# Patient Record
Sex: Male | Born: 1980
Health system: Southern US, Community
[De-identification: ages and names within clinical notes are randomized; demographics above are authoritative.]

## PROBLEM LIST (undated history)

## (undated) DIAGNOSIS — K219 Gastro-esophageal reflux disease without esophagitis: Secondary | ICD-10-CM

## (undated) DIAGNOSIS — I10 Essential (primary) hypertension: Secondary | ICD-10-CM

## (undated) DIAGNOSIS — Z87442 Personal history of urinary calculi: Secondary | ICD-10-CM

## (undated) DIAGNOSIS — N201 Calculus of ureter: Secondary | ICD-10-CM

## (undated) DIAGNOSIS — R3915 Urgency of urination: Secondary | ICD-10-CM

## (undated) DIAGNOSIS — N200 Calculus of kidney: Secondary | ICD-10-CM

## (undated) DIAGNOSIS — Z973 Presence of spectacles and contact lenses: Secondary | ICD-10-CM

## (undated) HISTORY — PX: ORCHIOPEXY: SUR915

---

## 2004-11-15 ENCOUNTER — Emergency Department (HOSPITAL_COMMUNITY): Admission: EM | Admit: 2004-11-15 | Discharge: 2004-11-15 | Payer: Self-pay | Admitting: Emergency Medicine

## 2006-02-20 ENCOUNTER — Emergency Department (HOSPITAL_COMMUNITY): Admission: EM | Admit: 2006-02-20 | Discharge: 2006-02-20 | Payer: Self-pay | Admitting: Emergency Medicine

## 2007-08-08 DIAGNOSIS — Q760 Spina bifida occulta: Secondary | ICD-10-CM | POA: Insufficient documentation

## 2007-08-08 DIAGNOSIS — K921 Melena: Secondary | ICD-10-CM | POA: Insufficient documentation

## 2007-08-08 DIAGNOSIS — K5900 Constipation, unspecified: Secondary | ICD-10-CM | POA: Insufficient documentation

## 2007-08-09 ENCOUNTER — Ambulatory Visit: Payer: Self-pay | Admitting: Gastroenterology

## 2007-08-09 DIAGNOSIS — K6289 Other specified diseases of anus and rectum: Secondary | ICD-10-CM | POA: Insufficient documentation

## 2007-08-09 DIAGNOSIS — R195 Other fecal abnormalities: Secondary | ICD-10-CM | POA: Insufficient documentation

## 2007-08-22 ENCOUNTER — Ambulatory Visit: Payer: Self-pay | Admitting: Gastroenterology

## 2007-08-22 ENCOUNTER — Encounter: Payer: Self-pay | Admitting: Gastroenterology

## 2007-08-22 HISTORY — PX: COLONOSCOPY: SHX174

## 2007-08-24 ENCOUNTER — Encounter: Payer: Self-pay | Admitting: Gastroenterology

## 2007-08-29 DIAGNOSIS — K5289 Other specified noninfective gastroenteritis and colitis: Secondary | ICD-10-CM | POA: Insufficient documentation

## 2007-08-30 ENCOUNTER — Telehealth: Payer: Self-pay | Admitting: Gastroenterology

## 2007-09-01 ENCOUNTER — Ambulatory Visit: Payer: Self-pay | Admitting: Cardiology

## 2007-09-20 ENCOUNTER — Telehealth: Payer: Self-pay | Admitting: Gastroenterology

## 2007-09-26 ENCOUNTER — Ambulatory Visit: Payer: Self-pay | Admitting: Gastroenterology

## 2013-10-31 ENCOUNTER — Ambulatory Visit (INDEPENDENT_AMBULATORY_CARE_PROVIDER_SITE_OTHER): Payer: BC Managed Care – PPO | Admitting: General Surgery

## 2014-08-09 ENCOUNTER — Encounter: Payer: Self-pay | Admitting: Gastroenterology

## 2015-06-17 ENCOUNTER — Encounter (HOSPITAL_COMMUNITY): Payer: Self-pay | Admitting: Emergency Medicine

## 2015-06-17 ENCOUNTER — Emergency Department (HOSPITAL_COMMUNITY): Payer: BLUE CROSS/BLUE SHIELD

## 2015-06-17 ENCOUNTER — Emergency Department (HOSPITAL_COMMUNITY)
Admission: EM | Admit: 2015-06-17 | Discharge: 2015-06-17 | Disposition: A | Discharge status: Home / Self Care | Payer: BLUE CROSS/BLUE SHIELD | Attending: Emergency Medicine | Admitting: Emergency Medicine

## 2015-06-17 DIAGNOSIS — R197 Diarrhea, unspecified: Secondary | ICD-10-CM | POA: Diagnosis not present

## 2015-06-17 DIAGNOSIS — N201 Calculus of ureter: Secondary | ICD-10-CM | POA: Diagnosis not present

## 2015-06-17 DIAGNOSIS — Z79899 Other long term (current) drug therapy: Secondary | ICD-10-CM | POA: Diagnosis not present

## 2015-06-17 DIAGNOSIS — I10 Essential (primary) hypertension: Secondary | ICD-10-CM | POA: Diagnosis not present

## 2015-06-17 DIAGNOSIS — R110 Nausea: Secondary | ICD-10-CM | POA: Diagnosis not present

## 2015-06-17 DIAGNOSIS — R109 Unspecified abdominal pain: Secondary | ICD-10-CM | POA: Diagnosis present

## 2015-06-17 DIAGNOSIS — R1031 Right lower quadrant pain: Secondary | ICD-10-CM | POA: Diagnosis not present

## 2015-06-17 HISTORY — DX: Essential (primary) hypertension: I10

## 2015-06-17 LAB — URINALYSIS, ROUTINE W REFLEX MICROSCOPIC
Bilirubin Urine: NEGATIVE
Glucose, UA: NEGATIVE mg/dL
KETONES UR: NEGATIVE mg/dL
LEUKOCYTES UA: NEGATIVE
NITRITE: NEGATIVE
PROTEIN: NEGATIVE mg/dL
Specific Gravity, Urine: 1.019 (ref 1.005–1.030)
pH: 8 (ref 5.0–8.0)

## 2015-06-17 LAB — POC OCCULT BLOOD, ED: Fecal Occult Bld: NEGATIVE

## 2015-06-17 LAB — URINE MICROSCOPIC-ADD ON
SQUAMOUS EPITHELIAL / LPF: NONE SEEN
WBC, UA: NONE SEEN WBC/hpf (ref 0–5)

## 2015-06-17 LAB — CBC
HEMATOCRIT: 43.2 % (ref 39.0–52.0)
HEMOGLOBIN: 13.8 g/dL (ref 13.0–17.0)
MCH: 27.8 pg (ref 26.0–34.0)
MCHC: 31.9 g/dL (ref 30.0–36.0)
MCV: 87.1 fL (ref 78.0–100.0)
Platelets: 231 10*3/uL (ref 150–400)
RBC: 4.96 MIL/uL (ref 4.22–5.81)
RDW: 13.4 % (ref 11.5–15.5)
WBC: 7 10*3/uL (ref 4.0–10.5)

## 2015-06-17 LAB — COMPREHENSIVE METABOLIC PANEL
ALBUMIN: 4.3 g/dL (ref 3.5–5.0)
ALT: 23 U/L (ref 17–63)
ANION GAP: 10 (ref 5–15)
AST: 20 U/L (ref 15–41)
Alkaline Phosphatase: 54 U/L (ref 38–126)
BUN: 10 mg/dL (ref 6–20)
CHLORIDE: 105 mmol/L (ref 101–111)
CO2: 25 mmol/L (ref 22–32)
Calcium: 9.9 mg/dL (ref 8.9–10.3)
Creatinine, Ser: 1.1 mg/dL (ref 0.61–1.24)
GFR calc Af Amer: >60 mL/min (ref >60)
GFR calc non Af Amer: >60 mL/min (ref >60)
GLUCOSE: 138 mg/dL — AB (ref 65–99)
POTASSIUM: 4 mmol/L (ref 3.5–5.1)
SODIUM: 140 mmol/L (ref 135–145)
TOTAL PROTEIN: 7.2 g/dL (ref 6.5–8.1)
Total Bilirubin: 0.5 mg/dL (ref 0.3–1.2)

## 2015-06-17 LAB — LIPASE, BLOOD: LIPASE: 24 U/L (ref 11–51)

## 2015-06-17 MED ORDER — HYDROMORPHONE HCL 1 MG/ML IJ SOLN
1.0000 mg | Freq: Once | INTRAMUSCULAR | Status: AC
  Administered 2015-06-17: 1 mg via INTRAVENOUS
  Filled 2015-06-17: qty 1

## 2015-06-17 MED ORDER — IOPAMIDOL (ISOVUE-370) INJECTION 76%
INTRAVENOUS | Status: AC
  Filled 2015-06-17: qty 100

## 2015-06-17 MED ORDER — ONDANSETRON HCL 4 MG/2ML IJ SOLN
4.0000 mg | Freq: Once | INTRAMUSCULAR | Status: AC
  Administered 2015-06-17: 4 mg via INTRAVENOUS
  Filled 2015-06-17: qty 2

## 2015-06-17 MED ORDER — SODIUM CHLORIDE 0.9 % IV BOLUS (SEPSIS)
1000.0000 mL | Freq: Once | INTRAVENOUS | Status: AC
  Administered 2015-06-17: 1000 mL via INTRAVENOUS

## 2015-06-17 MED ORDER — OXYCODONE-ACETAMINOPHEN 5-325 MG PO TABS: 2.0000 | ORAL_TABLET | ORAL | Status: DC | PRN

## 2015-06-17 MED ORDER — IOPAMIDOL (ISOVUE-300) INJECTION 61%
INTRAVENOUS | Status: AC
  Administered 2015-06-17: 100 mL
  Filled 2015-06-17: qty 100

## 2015-06-17 MED ORDER — TAMSULOSIN HCL 0.4 MG PO CAPS: 0.4000 mg | ORAL_CAPSULE | Freq: Every day | ORAL | Status: DC

## 2015-06-17 MED ORDER — ONDANSETRON HCL 4 MG PO TABS: 4.0000 mg | ORAL_TABLET | Freq: Four times a day (QID) | ORAL | Status: DC

## 2015-06-17 NOTE — ED Provider Notes (Signed)
CSN: [id number]     Arrival date & time 06/17/15  0804 History   First MD Initiated Contact with Patient 06/17/15 [phone]     Chief Complaint  Patient presents with  . Abdominal Pain   HPI Comments: 35 year old male with PMH of HTN who presents with right-sided abdominal pain and flank pain. He states he has had intermittent pain for several months however the pain usually goes away within 30 minutes. This morning it has been the worst and has been constant. Reports associated nausea, tenesmus, melena. Denies fever, chills, chest pain, shortness of breath, vomiting, constipation, dysuria, blood in urine. He has had a colonoscopy and endoscopy in 2009 with which has been suggestive of Crohn's disease and showed hemorrhoids. No hx of nephro or urolithiasis. Sg hx significant for Orchiopexy.  Patient is a 35 y.o. male presenting with abdominal pain.  Abdominal Pain Associated symptoms: diarrhea   Associated symptoms: no chest pain, no chills, no constipation, no dysuria, no fever, no hematuria, no nausea, no shortness of breath and no vomiting     Past Medical History  Diagnosis Date  . Hypertension    Past Surgical History  Procedure Laterality Date  . Testicle surgery     No family history on file. Social History  Substance Use Topics  . Smoking status: Never Smoker   . Smokeless tobacco: None  . Alcohol Use: Yes     Comment: social    Review of Systems  Constitutional: Negative for fever and chills.  Respiratory: Negative for shortness of breath.   Cardiovascular: Negative for chest pain.  Gastrointestinal: Positive for abdominal pain and diarrhea. Negative for nausea, vomiting, constipation and blood in stool.  Genitourinary: Positive for flank pain. Negative for dysuria, urgency, frequency, hematuria, penile swelling, difficulty urinating and testicular pain.  All other systems reviewed and are negative.   Allergies  Review of patient's allergies indicates no known  allergies.  Home Medications   Prior to Admission medications   Medication Sig Start Date End Date Taking? Authorizing Provider  amLODipine (NORVASC) 10 MG tablet Take 10 mg by mouth daily.   Yes Historical Provider, MD  lisinopril-hydrochlorothiazide (PRINZIDE,ZESTORETIC) 10-12.5 MG tablet Take 1 tablet by mouth daily.   Yes Historical Provider, MD  Naproxen Sodium (ALEVE) 220 MG CAPS Take 220 mg by mouth every 8 (eight) hours as needed.   Yes Historical Provider, MD   BP 147/90 mmHg  Pulse 97  Temp(Src) 97.9 F (36.6 C) (Oral)  Resp 17  Ht  (1.651 m)  Wt 76.204 kg  BMI 27.96 kg/m2  SpO2 100%   Physical Exam  Constitutional: He is oriented to person, place, and time. He appears well-developed and well-nourished. He appears distressed.  Writhing in bed  HENT:  Head: Normocephalic and atraumatic.  Eyes: Conjunctivae are normal. Pupils are equal, round, and reactive to light. Right eye exhibits no discharge. Left eye exhibits no discharge. No scleral icterus.  Neck: Normal range of motion.  Cardiovascular: Normal rate and regular rhythm.  Exam reveals no gallop and no friction rub.   No murmur heard. Pulmonary/Chest: Effort normal. No respiratory distress. He has no wheezes. He has no rales. He exhibits no tenderness.  Abdominal: Soft. Bowel sounds are normal. He exhibits no distension and no mass. There is tenderness. There is CVA tenderness. There is no rebound and no guarding. No hernia.  RLQ tenderness. R CVA tenderness  Genitourinary: Guaiac negative stool.  Rectal exam: Negative without mass, lesions or tenderness. Chaperone present  Neurological: He is alert and oriented to person, place, and time.  Skin: Skin is warm and dry.  Psychiatric: He has a normal mood and affect.    ED Course  Procedures (including critical care time) Labs Review Labs Reviewed  COMPREHENSIVE METABOLIC PANEL - Abnormal; Notable for the following:    Glucose, Bld 138 (*)    All other  components within normal limits  URINALYSIS, ROUTINE W REFLEX MICROSCOPIC (NOT AT [facility]) - Abnormal; Notable for the following:    APPearance TURBID (*)    Hgb urine dipstick LARGE (*)    All other components within normal limits  URINE MICROSCOPIC-ADD ON - Abnormal; Notable for the following:    Bacteria, UA RARE (*)    All other components within normal limits  LIPASE, BLOOD  CBC  POC OCCULT BLOOD, ED   Imaging Review Ct Abdomen Pelvis W Contrast  06/17/2015  CLINICAL DATA:  Right lower quadrant pain EXAM: CT ABDOMEN AND PELVIS WITH CONTRAST TECHNIQUE: Multidetector CT imaging of the abdomen and pelvis was performed using the standard protocol following bolus administration of intravenous contrast. CONTRAST:  ISOVUE-300 IOPAMIDOL (ISOVUE-300) INJECTION 61% COMPARISON:  09/01/2007 FINDINGS: 5 mm right ureterovesical junction calculus is associated with mild right hydronephrosis, mild right hydroureter, stranding adjacent to the proximal ureter, and delayed right nephrogram. Several hypodensities in both kidneys are not significantly changed. The previously seen calculus in the right kidney has passed to the right ureterovesical junction. Subsegmental atelectasis at the lung bases. Diffuse hepatic steatosis Gallbladder, spleen, pancreas, adrenal glands are within normal limits Normal appendix. Bladder and prostate are otherwise within normal limits. No focal mass in the colon. No disproportionate dilatation of small bowel. No free-fluid.  No abnormal retroperitoneal adenopathy. No vertebral compression deformity. IMPRESSION: 5 mm right ureterovesical junction calculus is associated with secondary findings of right ureteral obstruction. Electronically Signed   By: Jolaine Click M.D.   On: 06/17/2015 11:03   I have personally reviewed and evaluated these images and lab results as part of my medical decision-making.   EKG Interpretation None     Meds given in ED:  Medications  iopamidol  (ISOVUE-370) 76 % injection (not administered)  HYDROmorphone (DILAUDID) injection 1 mg (1 mg Intravenous Given 06/17/15 0915)  sodium chloride 0.9 % bolus 1,000 mL (1,000 mLs Intravenous New Bag/Given 06/17/15 0916)  ondansetron (ZOFRAN) injection 4 mg (4 mg Intravenous Given 06/17/15 0918)  HYDROmorphone (DILAUDID) injection 1 mg (1 mg Intravenous Given 06/17/15 0954)  iopamidol (ISOVUE-300) 61 % injection (100 mLs  Contrast Given 06/17/15 1048)    New Prescriptions   No medications on file     MDM   Final diagnoses:  Calculus of ureter  Nausea   35 year old male who presents with constant right sided abdominal pain since this morning. His symptoms are most likely due to urolithiasis. CT of abdomen shows 5mm obstructive stone in right ureterovesical junction with right hydronephrosis.   IVF, Dilaudid x 2 given which he states has brought his pain down to a 2/10, Zofran given  Labs are unremarkable. UA positive for RBCs without WBCs.Because he was reporting melena a rectal exam was done which was negative for occult blood. Vitals signs are stable.   He is afebrile, with clean urine, WBC 7.0, and has adequate pain control. Consulted Dr. Barron Alvine with urology who recommended outpatient follow up in 1-2 weeks with pain control and alpha blocker to allow stone to pass. Patient informed of clinical course, understands medical  decision-making process, and agrees with plan.   Bethel BornKelly Marie Gekas, PA-C 06/18/15 1213  Arby BarretteMarcy Pfeiffer, MD 06/20/15 [phone]

## 2015-06-17 NOTE — ED Notes (Signed)
Pt reports RLQ abd pain which radiates to his lower back. Pt alert x4. NAD at this time

## 2015-06-17 NOTE — ED Notes (Signed)
Pt ambulated to restroom. 

## 2015-06-21 DIAGNOSIS — Z0000 Encounter for general adult medical examination without abnormal findings: Secondary | ICD-10-CM | POA: Diagnosis not present

## 2015-06-21 DIAGNOSIS — N201 Calculus of ureter: Secondary | ICD-10-CM | POA: Diagnosis not present

## 2015-06-24 ENCOUNTER — Other Ambulatory Visit: Payer: Self-pay | Admitting: Urology

## 2015-06-24 ENCOUNTER — Encounter (HOSPITAL_COMMUNITY): Payer: Self-pay

## 2015-06-27 ENCOUNTER — Encounter (HOSPITAL_COMMUNITY)
Admission: RE | Disposition: A | Discharge status: Home / Self Care | Payer: Self-pay | Source: Ambulatory Visit | Attending: Urology

## 2015-06-27 ENCOUNTER — Ambulatory Visit (HOSPITAL_COMMUNITY): Payer: BLUE CROSS/BLUE SHIELD

## 2015-06-27 ENCOUNTER — Ambulatory Visit (HOSPITAL_COMMUNITY)
Admission: RE | Admit: 2015-06-27 | Discharge: 2015-06-27 | Disposition: A | Discharge status: Home / Self Care | Payer: BLUE CROSS/BLUE SHIELD | Source: Ambulatory Visit | Attending: Urology | Admitting: Urology

## 2015-06-27 ENCOUNTER — Encounter (HOSPITAL_COMMUNITY): Payer: Self-pay | Admitting: *Deleted

## 2015-06-27 DIAGNOSIS — Z791 Long term (current) use of non-steroidal anti-inflammatories (NSAID): Secondary | ICD-10-CM | POA: Insufficient documentation

## 2015-06-27 DIAGNOSIS — Z79899 Other long term (current) drug therapy: Secondary | ICD-10-CM | POA: Diagnosis not present

## 2015-06-27 DIAGNOSIS — Z87442 Personal history of urinary calculi: Secondary | ICD-10-CM | POA: Insufficient documentation

## 2015-06-27 DIAGNOSIS — I10 Essential (primary) hypertension: Secondary | ICD-10-CM | POA: Insufficient documentation

## 2015-06-27 DIAGNOSIS — N281 Cyst of kidney, acquired: Secondary | ICD-10-CM | POA: Diagnosis not present

## 2015-06-27 DIAGNOSIS — N201 Calculus of ureter: Secondary | ICD-10-CM | POA: Diagnosis not present

## 2015-06-27 DIAGNOSIS — Z841 Family history of disorders of kidney and ureter: Secondary | ICD-10-CM | POA: Insufficient documentation

## 2015-06-27 DIAGNOSIS — Z01818 Encounter for other preprocedural examination: Secondary | ICD-10-CM | POA: Diagnosis not present

## 2015-06-27 DIAGNOSIS — Z7982 Long term (current) use of aspirin: Secondary | ICD-10-CM | POA: Insufficient documentation

## 2015-06-27 HISTORY — PX: EXTRACORPOREAL SHOCK WAVE LITHOTRIPSY: SHX1557

## 2015-06-27 SURGERY — LITHOTRIPSY, ESWL
Anesthesia: LOCAL | Laterality: Right

## 2015-06-27 MED ORDER — CIPROFLOXACIN HCL 500 MG PO TABS
500.0000 mg | ORAL_TABLET | ORAL | Status: AC
  Administered 2015-06-27: 500 mg via ORAL
  Filled 2015-06-27: qty 1

## 2015-06-27 MED ORDER — OXYCODONE-ACETAMINOPHEN 5-325 MG PO TABS
1.0000 | ORAL_TABLET | Freq: Four times a day (QID) | ORAL | Status: DC | PRN
Start: 2015-06-27 — End: 2015-08-18

## 2015-06-27 MED ORDER — DIAZEPAM 5 MG PO TABS
10.0000 mg | ORAL_TABLET | ORAL | Status: AC
  Administered 2015-06-27: 10 mg via ORAL
  Filled 2015-06-27: qty 2

## 2015-06-27 MED ORDER — DIPHENHYDRAMINE HCL 25 MG PO CAPS
25.0000 mg | ORAL_CAPSULE | ORAL | Status: AC
  Administered 2015-06-27: 25 mg via ORAL
  Filled 2015-06-27: qty 1

## 2015-06-27 MED ORDER — SODIUM CHLORIDE 0.9 % IV SOLN
INTRAVENOUS | Status: DC
  Administered 2015-06-27: 15:00:00 via INTRAVENOUS

## 2015-06-27 NOTE — Interval H&P Note (Signed)
History and Physical Interval Note:  06/27/2015 3:44 PM  Arsen Chilton SiGreen  has presented today for surgery, with the diagnosis of RIGHT DISTAL STONE  The various methods of treatment have been discussed with the patient and family. After consideration of risks, benefits and other options for treatment, the patient has consented to  Procedure(s): RIGHT EXTRACORPOREAL SHOCK WAVE LITHOTRIPSY (ESWL) (Right) as a surgical intervention .  The patient's history has been reviewed, patient examined, no change in status, stable for surgery.  I have reviewed the patient's chart and labs.  Questions were answered to the patient's satisfaction.     Mahonri Seiden

## 2015-06-27 NOTE — H&P (Signed)
History of Present Illness Consult right ureteral stone referred by Dr. Donnald Garre. Patient seen in emergency department 4 days ago with right flank pain. CT scan revealed a 5 mm right UVJ stone with a stone visible on the scout image. I reviewed the images. No other stones were noted. Stable renal cysts. His UA showed 6-30 red blood cells with rare bacteria. BUN was 10 and creatinine 1.1. He passed a stone several years ago.     Today, his pain is controlled. He has some low back pain. He has not had right sided pain and not sure the stone passed. He is voiding without difficulty. He has some nausea but he is eating and drinking normally.   Past Medical History Problems  1. History of hypertension (Z86.79) 2. History of renal calculi (E95.284)  Surgical History Problems  1. History of Surgery Testis Exploration Of Undescended Testis Left  Current Meds 1. Aleve TABS;  Therapy: (Recorded:28Apr2017) to Recorded 2. AmLODIPine Besylate TABS;  Therapy: (Recorded:28Apr2017) to Recorded 3. Aspirin TABS;  Therapy: (Recorded:28Apr2017) to Recorded 4. Lisinopril-Hydrochlorothiazide TABS;  Therapy: (Recorded:28Apr2017) to Recorded  Allergies Medication  1. No Known Drug Allergies  Family History Problems  1. Family history of diabetes mellitus (Z83.3) : Father 2. Family history of hypertension (Z82.49) : Father 3. Family history of kidney stones (Z84.1) : Father 4. Family history of malignant neoplasm of prostate (Z44.42) : Grandfather  Social History Problems    Daily caffeine consumption, 1 serving a day   Married   Minimum alcohol consumption   Never a smoker   Two children  Review of Systems Genitourinary, constitutional, skin, eye, otolaryngeal, hematologic/lymphatic, cardiovascular, pulmonary, endocrine, musculoskeletal, gastrointestinal, neurological and psychiatric system(s) were reviewed and pertinent findings if present are noted and are otherwise negative.   Gastrointestinal: nausea and constipation.  Constitutional: night sweats and feeling tired (fatigue).  Respiratory: cough.  Musculoskeletal: back pain.  Neurological: headache.    Vitals Vital Signs [Data Includes: Last 1 Day]  Recorded: 28Apr2017 11:11AM  Height: 5 ft 5 in Weight: 168 lb  BMI Calculated: 27.96 BSA Calculated: 1.84 Blood Pressure: 143 / 84 Temperature: 98.5 F Heart Rate: 78  Physical Exam Constitutional: Well nourished and well developed . No acute distress.  Skin: Normal skin turgor, no visible rash and no visible skin lesions.    Results/Data Urine [Data Includes: Last 1 Day]   28Apr2017  COLOR YELLOW   APPEARANCE CLOUDY   SPECIFIC GRAVITY 1.010   pH 7.0   GLUCOSE NEGATIVE   BILIRUBIN NEGATIVE   KETONE NEGATIVE   BLOOD NEGATIVE   PROTEIN NEGATIVE   NITRITE NEGATIVE   LEUKOCYTE ESTERASE NEGATIVE   SQUAMOUS EPITHELIAL/HPF 0-5 HPF  WBC 0-5 WBC/HPF  RBC 0-2 RBC/HPF  BACTERIA NONE SEEN HPF  CRYSTALS See Below HPF  CASTS NONE SEEN LPF  Yeast NONE SEEN HPF   Old records or history reviewed:Marland Kitchen  The following images/tracing/specimen were independently visualized:  CT.    Procedure KUB-comparison to prior CT, findings: 5 mm density still present in the right distal ureter region, right pelvis. I didn't appreciate any other stones. The bones appeared normal. The bowel gas pattern appear normal.     Assessment Assessed  1. Right ureteral stone (N20.1)  Plan Right ureteral stone  1. Follow-up NP/PA Office  Follow-up  Status: Hold For - Appointment,Date of Service   Requested for: 28Apr2017 2. KUB; Status:Complete;   Done: 28Apr2017 11:41AM  Discussion/Summary Right ureteral stone-still present on KUB. We discussed the nature risk and  benefits of continued stone passage, off label use about blockers, ureteroscopy, shockwave lithotripsy. Patient interested in shockwave lithotripsy but would like to give it some time. He'll let me know next week if he  like to set that up.    cc: Dr. Donnald GarrePfeiffer     Signatures Electronically signed by : Jerilee FieldMatthew Dreyah Montrose, M.D.; Jun 21 2015 12:02PM EST

## 2015-06-27 NOTE — Op Note (Signed)
Right ESWL  Right 5 mm distal stone  Findings; stone faded well

## 2015-06-27 NOTE — Discharge Instructions (Signed)
Lithotripsy, Care After °Refer to this sheet in the next few weeks. These instructions provide you with information on caring for yourself after your procedure. Your health care provider may also give you more specific instructions. Your treatment has been planned according to current medical practices, but problems sometimes occur. Call your health care provider if you have any problems or questions after your procedure. °WHAT TO EXPECT AFTER THE PROCEDURE  °· Your urine may have a red tinge for a few days after treatment. Blood loss is usually minimal. °· You may have soreness in the back or flank area. This usually goes away after a few days. The procedure can cause blotches or bruises on the back where the pressure wave enters the skin. These marks usually cause only minimal discomfort and should disappear in a short time. °· Stone fragments should begin to pass within 24 hours of treatment. However, a delayed passage is not unusual. °· You may have pain, discomfort, and feel sick to your stomach (nauseated) when the crushed fragments of stone are passed down the tube from the kidney to the bladder. Stone fragments can pass soon after the procedure and may last for up to 4-8 weeks. °· A small number of patients may have severe pain when stone fragments are not able to pass, which leads to an obstruction. °· If your stone is greater than 1 inch (2.5 cm) in diameter or if you have multiple stones that have a combined diameter greater than 1 inch (2.5 cm), you may require more than one treatment. °· If you had a stent placed prior to your procedure, you may experience some discomfort, especially during urination. You may experience the pain or discomfort in your flank or back, or you may experience a sharp pain or discomfort at the base of your penis or in your lower abdomen. The discomfort usually lasts only a few minutes after urinating. °HOME CARE INSTRUCTIONS  °· Rest at home until you feel your energy  improving. °· Only take over-the-counter or prescription medicines for pain, discomfort, or fever as directed by your health care provider. Depending on the type of lithotripsy, you may need to take antibiotics and anti-inflammatory medicines for a few days. °· Drink enough water and fluids to keep your urine clear or pale yellow. This helps "flush" your kidneys. It helps pass any remaining pieces of stone and prevents stones from coming back. °· Most people can resume daily activities within 1-2 days after standard lithotripsy. It can take longer to recover from laser and percutaneous lithotripsy. °· Strain all urine through the provided strainer. Keep all particulate matter and stones for your health care provider to see. The stone may be as small as a grain of salt. It is very important to use the strainer each and every time you pass your urine. Any stones that are found can be sent to a medical lab for examination. °· Visit your health care provider for a follow-up appointment in a few weeks. Your doctor may remove your stent if you have one. Your health care provider will also check to see whether stone particles still remain. °SEEK MEDICAL CARE IF:  °· Your pain is not relieved by medicine. °· You have a lasting nauseous feeling. °· You feel there is too much blood in the urine. °· You develop persistent problems with frequent or painful urination that does not at least partially improve after 2 days following the procedure. °· You have a congested cough. °· You feel   lightheaded. °· You develop a rash or any other signs that might suggest an allergic problem. °· You develop any reaction or side effects to your medicine(s). °SEEK IMMEDIATE MEDICAL CARE IF:  °· You experience severe back or flank pain or both. °· You see nothing but blood when you urinate. °· You cannot pass any urine at all. °· You have a fever or shaking chills. °· You develop shortness of breath, difficulty breathing, or chest pain. °· You  develop vomiting that will not stop after 6-8 hours. °· You have a fainting episode. °  °This information is not intended to replace advice given to you by your health care provider. Make sure you discuss any questions you have with your health care provider. °  °Document Released: 03/01/2007 Document Revised: 10/31/2014 Document Reviewed: 08/25/2012 °Elsevier Interactive Patient Education ©2016 [employer] ° °

## 2015-07-18 DIAGNOSIS — N201 Calculus of ureter: Secondary | ICD-10-CM | POA: Diagnosis not present

## 2015-07-18 DIAGNOSIS — Z0000 Encounter for general adult medical examination without abnormal findings: Secondary | ICD-10-CM | POA: Diagnosis not present

## 2015-07-26 DIAGNOSIS — N201 Calculus of ureter: Secondary | ICD-10-CM | POA: Diagnosis not present

## 2015-08-18 ENCOUNTER — Emergency Department (HOSPITAL_COMMUNITY)
Admission: EM | Admit: 2015-08-18 | Discharge: 2015-08-18 | Disposition: A | Discharge status: Home / Self Care | Payer: BLUE CROSS/BLUE SHIELD | Attending: Emergency Medicine | Admitting: Emergency Medicine

## 2015-08-18 ENCOUNTER — Emergency Department (HOSPITAL_COMMUNITY): Payer: BLUE CROSS/BLUE SHIELD

## 2015-08-18 ENCOUNTER — Encounter (HOSPITAL_COMMUNITY): Payer: Self-pay

## 2015-08-18 DIAGNOSIS — R1031 Right lower quadrant pain: Secondary | ICD-10-CM | POA: Diagnosis present

## 2015-08-18 DIAGNOSIS — N201 Calculus of ureter: Secondary | ICD-10-CM | POA: Diagnosis not present

## 2015-08-18 DIAGNOSIS — I10 Essential (primary) hypertension: Secondary | ICD-10-CM | POA: Diagnosis not present

## 2015-08-18 DIAGNOSIS — Z79899 Other long term (current) drug therapy: Secondary | ICD-10-CM | POA: Insufficient documentation

## 2015-08-18 DIAGNOSIS — N23 Unspecified renal colic: Secondary | ICD-10-CM | POA: Insufficient documentation

## 2015-08-18 LAB — URINE MICROSCOPIC-ADD ON
Bacteria, UA: NONE SEEN
Squamous Epithelial / LPF: NONE SEEN
WBC, UA: NONE SEEN WBC/hpf (ref 0–5)

## 2015-08-18 LAB — COMPREHENSIVE METABOLIC PANEL
ALT: 23 U/L (ref 17–63)
AST: 21 U/L (ref 15–41)
Albumin: 4.1 g/dL (ref 3.5–5.0)
Alkaline Phosphatase: 52 U/L (ref 38–126)
Anion gap: 4 — ABNORMAL LOW (ref 5–15)
BUN: 11 mg/dL (ref 6–20)
CHLORIDE: 110 mmol/L (ref 101–111)
CO2: 25 mmol/L (ref 22–32)
Calcium: 9.4 mg/dL (ref 8.9–10.3)
Creatinine, Ser: 1.15 mg/dL (ref 0.61–1.24)
GFR calc non Af Amer: >60 mL/min (ref >60)
Glucose, Bld: 125 mg/dL — ABNORMAL HIGH (ref 65–99)
Potassium: 4.2 mmol/L (ref 3.5–5.1)
SODIUM: 139 mmol/L (ref 135–145)
Total Bilirubin: 0.4 mg/dL (ref 0.3–1.2)
Total Protein: 7.2 g/dL (ref 6.5–8.1)

## 2015-08-18 LAB — URINALYSIS, ROUTINE W REFLEX MICROSCOPIC
BILIRUBIN URINE: NEGATIVE
GLUCOSE, UA: NEGATIVE mg/dL
Ketones, ur: NEGATIVE mg/dL
Leukocytes, UA: NEGATIVE
Nitrite: NEGATIVE
PH: 6 (ref 5.0–8.0)
Protein, ur: NEGATIVE mg/dL
SPECIFIC GRAVITY, URINE: 1.03 (ref 1.005–1.030)

## 2015-08-18 LAB — CBC WITH DIFFERENTIAL/PLATELET
Basophils Absolute: 0 10*3/uL (ref 0.0–0.1)
Basophils Relative: 0 %
EOS ABS: 0 10*3/uL (ref 0.0–0.7)
Eosinophils Relative: 1 %
HEMATOCRIT: 41.1 % (ref 39.0–52.0)
HEMOGLOBIN: 13.2 g/dL (ref 13.0–17.0)
LYMPHS ABS: 0.9 10*3/uL (ref 0.7–4.0)
Lymphocytes Relative: 14 %
MCH: 27.4 pg (ref 26.0–34.0)
MCHC: 32.1 g/dL (ref 30.0–36.0)
MCV: 85.4 fL (ref 78.0–100.0)
MONOS PCT: 5 %
Monocytes Absolute: 0.4 10*3/uL (ref 0.1–1.0)
NEUTROS PCT: 80 %
Neutro Abs: 5.5 10*3/uL (ref 1.7–7.7)
Platelets: 223 10*3/uL (ref 150–400)
RBC: 4.81 MIL/uL (ref 4.22–5.81)
RDW: 13.2 % (ref 11.5–15.5)
WBC: 6.9 10*3/uL (ref 4.0–10.5)

## 2015-08-18 MED ORDER — KETOROLAC TROMETHAMINE 15 MG/ML IJ SOLN
15.0000 mg | Freq: Once | INTRAMUSCULAR | Status: AC
  Administered 2015-08-18: 15 mg via INTRAVENOUS
  Filled 2015-08-18: qty 1

## 2015-08-18 MED ORDER — SODIUM CHLORIDE 0.9 % IV BOLUS (SEPSIS)
1000.0000 mL | Freq: Once | INTRAVENOUS | Status: AC
Start: 2015-08-18 — End: 2015-08-18
  Administered 2015-08-18: 1000 mL via INTRAVENOUS

## 2015-08-18 MED ORDER — OXYCODONE-ACETAMINOPHEN 5-325 MG PO TABS: 1.0000 | ORAL_TABLET | Freq: Four times a day (QID) | ORAL | Status: DC | PRN

## 2015-08-18 MED ORDER — HYDROMORPHONE HCL 1 MG/ML IJ SOLN
1.0000 mg | Freq: Once | INTRAMUSCULAR | Status: AC
  Administered 2015-08-18: 1 mg via INTRAVENOUS
  Filled 2015-08-18: qty 1

## 2015-08-18 NOTE — ED Provider Notes (Signed)
CSN: [id number]     Arrival date & time 08/18/15  [id number] History   First MD Initiated Contact with Patient 08/18/15 [phone]     Chief Complaint  Patient presents with  . Flank Pain     Patient is a 35 y.o. male presenting with flank pain. The history is provided by the patient. No language interpreter was used.  Flank Pain   Willie Simpson is a 35 y.o. male who presents to the Emergency Department complaining of right side pain.  Mr. Chilton Simpson has a history of a right-sided kidney stone. He has been followed by urology and has had lithotripsy. He was last seen 3-1/2 weeks ago was told the stone hadn't moved. He's had intermittent pain since April. This morning he had severe worsening of his pain in the right lower quadrant and right flank. He has associated nausea and vomiting. This pain feels like his kidney stone pain. He denies any fevers, dysuria, diarrhea. He does have decreased urinary output. He sees Dr. Mena GoesEskridge with Urology.  He took Percocet at home without any improvement in the symptoms. He continues to take Flomax.  Past Medical History  Diagnosis Date  . Hypertension    Past Surgical History  Procedure Laterality Date  . Testicle surgery     History reviewed. No pertinent family history. Social History  Substance Use Topics  . Smoking status: Never Smoker   . Smokeless tobacco: None  . Alcohol Use: Yes     Comment: social    Review of Systems  Genitourinary: Positive for flank pain.  All other systems reviewed and are negative.     Allergies  Review of patient's allergies indicates no known allergies.  Home Medications   Prior to Admission medications   Medication Sig Start Date End Date Taking? Authorizing Provider  amLODipine (NORVASC) 10 MG tablet Take 10 mg by mouth daily.    Historical Provider, MD  lisinopril-hydrochlorothiazide (PRINZIDE,ZESTORETIC) 10-12.5 MG tablet Take 1 tablet by mouth daily.    Historical Provider, MD  Naproxen Sodium (ALEVE) 220 MG CAPS  Take 220 mg by mouth every 8 (eight) hours as needed.    Historical Provider, MD  ondansetron (ZOFRAN) 4 MG tablet Take 1 tablet (4 mg total) by mouth every 6 (six) hours. 06/17/15   Bethel BornKelly Marie Gekas, PA-C  oxyCODONE-acetaminophen (ROXICET) 5-325 MG tablet Take 1-2 tablets by mouth every 6 (six) hours as needed for severe pain. 08/18/15   Tilden FossaElizabeth Gayle Martinez, MD  tamsulosin (FLOMAX) 0.4 MG CAPS capsule Take 1 capsule (0.4 mg total) by mouth daily. 06/17/15   Bethel BornKelly Marie Gekas, PA-C   BP 142/98 mmHg  Pulse 92  Temp(Src) 97.6 F (36.4 C) (Oral)  Resp 16  SpO2 99% Physical Exam  Constitutional: He is oriented to person, place, and time. He appears well-developed.  HENT:  Head: Normocephalic and atraumatic.  Cardiovascular: Normal rate and regular rhythm.   No murmur heard. Pulmonary/Chest: Effort normal and breath sounds normal. No respiratory distress.  Abdominal: Soft. There is no tenderness. There is no rebound and no guarding.  Right CVA tenderness  Musculoskeletal: He exhibits no edema or tenderness.  Neurological: He is alert and oriented to person, place, and time.  Skin: Skin is warm and dry.  Psychiatric: He has a normal mood and affect. His behavior is normal.  Nursing note and vitals reviewed.   ED Course  Procedures (including critical care time) Labs Review Labs Reviewed  COMPREHENSIVE METABOLIC PANEL - Abnormal; Notable for the following:  Glucose, Bld 125 (*)    Anion gap 4 (*)    All other components within normal limits  URINALYSIS, ROUTINE W REFLEX MICROSCOPIC (NOT [facility]) - Abnormal; Notable for the following:    Hgb urine dipstick LARGE (*)    All other components within normal limits  CBC WITH DIFFERENTIAL/PLATELET  URINE MICROSCOPIC-ADD ON    Imaging Review Dg Abd 1 View  08/18/2015  CLINICAL DATA:  Known right kidney stone. Right lower abdominal pain and lower back pain since this morning. EXAM: ABDOMEN - 1 VIEW COMPARISON:  Abdominal plain films dated  07/26/2015, 07/18/2015 and 06/27/2015. Comparison also made to CT abdomen dated 06/17/2015. FINDINGS: The 5 mm calcification in the right lower pelvis is stable in position, compatible with the UVJ stone demonstrated on earlier CT. The additional small phlebolith in the left lower pelvis is stable. No new calculi identified. Bowel gas pattern is nonobstructive. No evidence of soft tissue mass or abnormal fluid collection. No evidence of free intraperitoneal air. Osseous structures are unremarkable. Lung bases are clear. IMPRESSION: Stable 5 mm stone at the level of the right UVJ.  No new findings. Electronically Signed   By: Bary RichardStan  Maynard M.D.   On: 08/18/2015 10:31   I have personally reviewed and evaluated these images and lab results as part of my medical decision-making.   EKG Interpretation None      MDM   Final diagnoses:  Renal colic on right side    Patient with history of renal colic here for recurrent pain not controlled with home medications. Abdominal film demonstrates a 5 mm stone that is likely at the UVJ. His pain is controlled in the department following Toradol and Dilaudid. No evidence of acute kidney injury or urinary tract infection. Plan to DC home with close urology follow-up for further evaluation and management. Home care and return precautions were discussed.    Tilden FossaElizabeth Peri Kreft, MD 08/18/15 [phone]

## 2015-08-18 NOTE — Discharge Instructions (Signed)
Kidney Stones °Kidney stones (urolithiasis) are deposits that form inside your kidneys. The intense pain is caused by the stone moving through the urinary tract. When the stone moves, the ureter goes into spasm around the stone. The stone is usually passed in the urine.  °CAUSES  °· A disorder that makes certain neck glands produce too much parathyroid hormone (primary hyperparathyroidism). °· A buildup of uric acid crystals, similar to gout in your joints. °· Narrowing (stricture) of the ureter. °· A kidney obstruction present at birth (congenital obstruction). °· Previous surgery on the kidney or ureters. °· Numerous kidney infections. °SYMPTOMS  °· Feeling sick to your stomach (nauseous). °· Throwing up (vomiting). °· Blood in the urine (hematuria). °· Pain that usually spreads (radiates) to the groin. °· Frequency or urgency of urination. °DIAGNOSIS  °· Taking a history and physical exam. °· Blood or urine tests. °· CT scan. °· Occasionally, an examination of the inside of the urinary bladder (cystoscopy) is performed. °TREATMENT  °· Observation. °· Increasing your fluid intake. °· Extracorporeal shock wave lithotripsy--This is a noninvasive procedure that uses shock waves to break up kidney stones. °· Surgery may be needed if you have severe pain or persistent obstruction. There are various surgical procedures. Most of the procedures are performed with the use of small instruments. Only small incisions are needed to accommodate these instruments, so recovery time is minimized. °The size, location, and chemical composition are all important variables that will determine the proper choice of action for you. Talk to your health care provider to better understand your situation so that you will minimize the risk of injury to yourself and your kidney.  °HOME CARE INSTRUCTIONS  °· Drink enough water and fluids to keep your urine clear or pale yellow. This will help you to pass the stone or stone fragments. °· Strain  all urine through the provided strainer. Keep all particulate matter and stones for your health care provider to see. The stone causing the pain may be as small as a grain of salt. It is very important to use the strainer each and every time you pass your urine. The collection of your stone will allow your health care provider to analyze it and verify that a stone has actually passed. The stone analysis will often identify what you can do to reduce the incidence of recurrences. °· Only take over-the-counter or prescription medicines for pain, discomfort, or fever as directed by your health care provider. °· Keep all follow-up visits as told by your health care provider. This is important. °· Get follow-up X-rays if required. The absence of pain does not always mean that the stone has passed. It may have only stopped moving. If the urine remains completely obstructed, it can cause loss of kidney function or even complete destruction of the kidney. It is your responsibility to make sure X-rays and follow-ups are completed. Ultrasounds of the kidney can show blockages and the status of the kidney. Ultrasounds are not associated with any radiation and can be performed easily in a matter of minutes. °· Make changes to your daily diet as told by your health care provider. You may be told to: °¨ Limit the amount of salt that you eat. °¨ Eat 5 or more servings of fruits and vegetables each day. °¨ Limit the amount of meat, poultry, fish, and eggs that you eat. °· Collect a 24-hour urine sample as told by your health care provider. You may need to collect another urine sample every 6-12   months. °SEEK MEDICAL CARE IF: °· You experience pain that is progressive and unresponsive to any pain medicine you have been prescribed. °SEEK IMMEDIATE MEDICAL CARE IF:  °· Pain cannot be controlled with the prescribed medicine. °· You have a fever or shaking chills. °· The severity or intensity of pain increases over 18 hours and is not  relieved by pain medicine. °· You develop a new onset of abdominal pain. °· You feel faint or pass out. °· You are unable to urinate. °  °This information is not intended to replace advice given to you by your health care provider. Make sure you discuss any questions you have with your health care provider. °  °Document Released: 02/09/2005 Document Revised: 10/31/2014 Document Reviewed: 07/13/2012 °Elsevier Interactive Patient Education ©2016 [employer] ° °

## 2015-08-18 NOTE — ED Notes (Signed)
Pt reports he went to use the restroom this morning around 0600 and began having abd and back pain. Pt reports he has a kidney stone he has been trying to pass since April. Pt reports he took a percocet at home which improved his pain but did not eliminate. Pt also endorses one episode of vomiting this morning.

## 2015-08-18 NOTE — ED Notes (Signed)
Pt reports he feels like he needs to have a bowel movement, pt ambulated to the restroom.

## 2015-08-22 DIAGNOSIS — N201 Calculus of ureter: Secondary | ICD-10-CM | POA: Diagnosis not present

## 2015-08-23 ENCOUNTER — Other Ambulatory Visit: Payer: Self-pay | Admitting: Urology

## 2015-08-23 ENCOUNTER — Encounter (HOSPITAL_BASED_OUTPATIENT_CLINIC_OR_DEPARTMENT_OTHER): Payer: Self-pay | Admitting: *Deleted

## 2015-08-29 ENCOUNTER — Encounter (HOSPITAL_BASED_OUTPATIENT_CLINIC_OR_DEPARTMENT_OTHER): Payer: Self-pay | Admitting: *Deleted

## 2015-08-29 NOTE — Progress Notes (Signed)
NPO AFTER MN.  ARRIVE AT 0600.  NEEDS EKG .  CURRENT LAB RESULTS IN CHART AND EPIC.  WILL TAKE FLOMAX AM DOS W/ SIPS OF WATER.

## 2015-09-02 NOTE — H&P (Signed)
Office Visit Report     08/22/2015   --------------------------------------------------------------------------------   Willie Simpson  MRN: [id number]  PRIMARY CARE:    DOB: 1980-06-10, 35 year old Male  REFERRING:    SSN: [id number]  PROVIDER:  Jerilee Field, M.D.    LOCATION:  [facility] [phone]   --------------------------------------------------------------------------------   CC: I have ureteral stone.  HPI: Willie Simpson is a 35 year-old male established patient who is here for ureteral stone.  The problem is on the right side. He first stated noticing pain on approximately 06/13/2015. This is not his first kidney stone. He is currently having flank pain. He denies having back pain, groin pain, nausea, vomiting, fever, and chills. Pain is occuring on the right side. He has not caught a stone in his urine strainer since his symptoms began.   He has had eswl for treatment of his stones in the past.   ESWL prone - Jun 27, 2015   Pt recurrent RLQ pain and went to ED 6/25/207 - KUB stone at right UVJ.     ALLERGIES: No Allergies    MEDICATIONS: Aleve TABS Oral  AmLODIPine Besylate TABS Oral  Ketorolac Tromethamine 10 MG Oral Tablet 0 Oral  Lisinopril-Hydrochlorothiazide TABS Oral  Promethazine HCl - 25 MG Oral Tablet Oral  Tamsulosin HCl - 0.4 MG Oral Capsule 0 Oral     GU PSH: Explore Undesc Testis Inguinal - 06/21/2015 Renal ESWL - 06/28/2015, 06/28/2015      PSH Notes: Renal Lithotripsy, Renal Lithotripsy, Surgery Testis Exploration Of Undescended Testis Left   NON-GU PSH: No Non-GU PSH    GU PMH: Calculus Ureter, Right ureteral stone - 07/26/2015 Personal Hx urinary calculi, History of renal calculi - 06/21/2015    NON-GU PMH: Encounter for general adult medical examination without abnormal findings, Encounter for preventive health examination - 07/26/2015 Personal history of other diseases of the circulatory system, History of hypertension - 06/21/2015     FAMILY HISTORY: Diabetes - Runs In Family Hypertension - Runs In Family Kidney Stones - Runs In Family malignant neoplasm of prostate - Runs In Family   SOCIAL HISTORY: Marital Status: Married Current Smoking Status: Patient has never smoked.  Social Drinker.  Does not drink caffeine.     Notes: Never a smoker, Two children, Minimum alcohol consumption, Married, Daily caffeine consumption, 1 serving a day   REVIEW OF SYSTEMS:    GU Review Male:   Patient reports get up at night to urinate. Patient denies frequent urination, hard to postpone urination, burning/ pain with urination, leakage of urine, stream starts and stops, trouble starting your stream, have to strain to urinate , erection problems, and penile pain.  Gastrointestinal (Upper):   Patient reports nausea. Patient denies vomiting and indigestion/ heartburn.  Gastrointestinal (Lower):   Patient denies diarrhea and constipation.  Constitutional:   Patient denies fever, night sweats, weight loss, and fatigue.  Skin:   Patient denies skin rash/ lesion and itching.  Eyes:   Patient denies blurred vision and double vision.  Ears/ Nose/ Throat:   Patient denies sore throat and sinus problems.  Hematologic/Lymphatic:   Patient denies swollen glands and easy bruising.  Cardiovascular:   Patient denies leg swelling and chest pains.  Respiratory:   Patient denies cough and shortness of breath.  Endocrine:   Patient denies excessive thirst.  Musculoskeletal:   Patient reports back pain. Patient denies joint pain.  Neurological:   Patient denies headaches and dizziness.  Psychologic:   Patient  denies depression and anxiety.   VITAL SIGNS:      08/22/2015 11:07 AM  Weight 170 lb / 77.11 kg  BP 162/99 mmHg  Pulse 67 /min  Temperature 98.3 F / 37 C   MULTI-SYSTEM PHYSICAL EXAMINATION:    Constitutional: Well-nourished. No physical deformities. Normally developed. Good grooming.  Neck: Neck symmetrical, not swollen. Normal tracheal  position.  Respiratory: No labored breathing, no use of accessory muscles.   Cardiovascular: Normal temperature, normal extremity pulses, no swelling, no varicosities.  Skin: No paleness, no jaundice, no cyanosis. No lesion, no ulcer, no rash.  Neurologic / Psychiatric: Oriented to time, oriented to place, oriented to person. No depression, no anxiety, no agitation.     PAST DATA REVIEWED:  Source Of History:  Patient   PROCEDURES:          Urinalysis w/Scope - 81001 Dipstick Dipstick Cont'd Micro  Specimen: Voided Bilirubin: Neg WBC/hpf: 0-5/hpf  Color: Yellow Ketones: Neg RBC/hpf: 3-10/hpf  Appearance: Clear Blood: 1+ Bacteria: NS (Not Seen)  Specific Gravity: 1.020 Protein: Neg Cystals: NS (Not Seen)  pH: 6.0 Urobilinogen: 0.2 Casts: NS (Not Seen)  Glucose: Neg Nitrites: Neg Trichomonas: Not Present    Leukocyte Esterase: Neg Mucous: Not Present      Epithelial Cells: 0-5/hpf      Yeast: NS (Not Seen)      Sperm: Not Present    ASSESSMENT:      ICD-10 Details  1 GU:   Calculus Ureter - N20.1    PLAN:           Schedule Return Visit: Next Available Appointment - Schedule Surgery          Document Letter(s):  Created for Patient: Clinical Summary         Notes:   right distal stone -- I discussed with the patient the nature risks and benefits of continued stone passage, off label use of alpha blockers, repeat shockwave lithotripsy or right ureteroscopy/stent. All questions answered. He elects to proceed with rt URs. Discussed again need for staged procedure/prestent.   cc: Dr. Sharol GivenKoiala     Signed by Jerilee FieldMatthew Skeeter Sheard, M.D. on 08/22/15 at 4:49 PM (EDT)

## 2015-09-03 ENCOUNTER — Encounter (HOSPITAL_BASED_OUTPATIENT_CLINIC_OR_DEPARTMENT_OTHER): Payer: Self-pay | Admitting: *Deleted

## 2015-09-03 ENCOUNTER — Ambulatory Visit (HOSPITAL_BASED_OUTPATIENT_CLINIC_OR_DEPARTMENT_OTHER): Payer: BLUE CROSS/BLUE SHIELD | Admitting: Anesthesiology

## 2015-09-03 ENCOUNTER — Encounter (HOSPITAL_BASED_OUTPATIENT_CLINIC_OR_DEPARTMENT_OTHER)
Admission: RE | Disposition: A | Discharge status: Home / Self Care | Payer: Self-pay | Source: Ambulatory Visit | Attending: Urology

## 2015-09-03 ENCOUNTER — Ambulatory Visit (HOSPITAL_BASED_OUTPATIENT_CLINIC_OR_DEPARTMENT_OTHER)
Admission: RE | Admit: 2015-09-03 | Discharge: 2015-09-03 | Disposition: A | Discharge status: Home / Self Care | Payer: BLUE CROSS/BLUE SHIELD | Source: Ambulatory Visit | Attending: Urology | Admitting: Urology

## 2015-09-03 DIAGNOSIS — Z841 Family history of disorders of kidney and ureter: Secondary | ICD-10-CM | POA: Insufficient documentation

## 2015-09-03 DIAGNOSIS — Z79899 Other long term (current) drug therapy: Secondary | ICD-10-CM | POA: Diagnosis not present

## 2015-09-03 DIAGNOSIS — N201 Calculus of ureter: Secondary | ICD-10-CM | POA: Diagnosis not present

## 2015-09-03 DIAGNOSIS — I10 Essential (primary) hypertension: Secondary | ICD-10-CM | POA: Diagnosis not present

## 2015-09-03 DIAGNOSIS — Z791 Long term (current) use of non-steroidal anti-inflammatories (NSAID): Secondary | ICD-10-CM | POA: Insufficient documentation

## 2015-09-03 HISTORY — PX: CYSTOSCOPY/URETEROSCOPY/HOLMIUM LASER/STENT PLACEMENT: SHX6546

## 2015-09-03 HISTORY — DX: Urgency of urination: R39.15

## 2015-09-03 HISTORY — DX: Calculus of ureter: N20.1

## 2015-09-03 HISTORY — PX: STONE EXTRACTION WITH BASKET: SHX5318

## 2015-09-03 HISTORY — PX: CYSTOSCOPY W/ RETROGRADES: SHX1426

## 2015-09-03 HISTORY — PX: HOLMIUM LASER APPLICATION: SHX5852

## 2015-09-03 HISTORY — DX: Personal history of urinary calculi: Z87.442

## 2015-09-03 SURGERY — CYSTOSCOPY/URETEROSCOPY/HOLMIUM LASER/STENT PLACEMENT
Anesthesia: General | Site: Renal | Laterality: Right

## 2015-09-03 MED ORDER — OXYCODONE-ACETAMINOPHEN 5-325 MG PO TABS
1.0000 | ORAL_TABLET | Freq: Once | ORAL | Status: AC
  Administered 2015-09-03: 1 via ORAL
  Filled 2015-09-03: qty 1

## 2015-09-03 MED ORDER — GLYCOPYRROLATE 0.2 MG/ML IJ SOLN
INTRAMUSCULAR | Status: DC | PRN
  Administered 2015-09-03: 0.2 mg via INTRAVENOUS

## 2015-09-03 MED ORDER — FENTANYL CITRATE (PF) 100 MCG/2ML IJ SOLN
INTRAMUSCULAR | Status: AC
  Filled 2015-09-03: qty 2

## 2015-09-03 MED ORDER — HYDROMORPHONE HCL 1 MG/ML IJ SOLN
0.2500 mg | INTRAMUSCULAR | Status: DC | PRN
  Administered 2015-09-03: 0.5 mg via INTRAVENOUS
  Filled 2015-09-03: qty 1

## 2015-09-03 MED ORDER — ONDANSETRON HCL 4 MG/2ML IJ SOLN
4.0000 mg | Freq: Once | INTRAMUSCULAR | Status: DC | PRN
  Filled 2015-09-03: qty 2

## 2015-09-03 MED ORDER — ONDANSETRON HCL 4 MG/2ML IJ SOLN
INTRAMUSCULAR | Status: DC | PRN
Start: 2015-09-03 — End: 2015-09-03
  Administered 2015-09-03: 4 mg via INTRAVENOUS

## 2015-09-03 MED ORDER — CEFAZOLIN SODIUM-DEXTROSE 2-4 GM/100ML-% IV SOLN
INTRAVENOUS | Status: AC
  Filled 2015-09-03: qty 100

## 2015-09-03 MED ORDER — BELLADONNA ALKALOIDS-OPIUM 16.2-60 MG RE SUPP
RECTAL | Status: AC
  Filled 2015-09-03: qty 1

## 2015-09-03 MED ORDER — CEFAZOLIN IN D5W 1 GM/50ML IV SOLN
1.0000 g | INTRAVENOUS | Status: DC
  Filled 2015-09-03: qty 50

## 2015-09-03 MED ORDER — EPHEDRINE SULFATE 50 MG/ML IJ SOLN
INTRAMUSCULAR | Status: AC
  Filled 2015-09-03: qty 1

## 2015-09-03 MED ORDER — MEPERIDINE HCL 25 MG/ML IJ SOLN
6.2500 mg | INTRAMUSCULAR | Status: DC | PRN
  Filled 2015-09-03: qty 1

## 2015-09-03 MED ORDER — MIDAZOLAM HCL 2 MG/2ML IJ SOLN
INTRAMUSCULAR | Status: AC
  Filled 2015-09-03: qty 2

## 2015-09-03 MED ORDER — EPHEDRINE SULFATE 50 MG/ML IJ SOLN
INTRAMUSCULAR | Status: DC | PRN
  Administered 2015-09-03: 5 mg via INTRAVENOUS
  Administered 2015-09-03: 10 mg via INTRAVENOUS

## 2015-09-03 MED ORDER — FENTANYL CITRATE (PF) 100 MCG/2ML IJ SOLN
INTRAMUSCULAR | Status: DC | PRN
  Administered 2015-09-03: 100 ug via INTRAVENOUS
  Administered 2015-09-03: 25 ug via INTRAVENOUS

## 2015-09-03 MED ORDER — PHENYLEPHRINE HCL 10 MG/ML IJ SOLN
INTRAMUSCULAR | Status: DC | PRN
  Administered 2015-09-03 (×2): 40 ug via INTRAVENOUS

## 2015-09-03 MED ORDER — ONDANSETRON HCL 4 MG/2ML IJ SOLN
INTRAMUSCULAR | Status: AC
  Filled 2015-09-03: qty 2

## 2015-09-03 MED ORDER — CEPHALEXIN 500 MG PO CAPS: 500.0000 mg | ORAL_CAPSULE | Freq: Two times a day (BID) | ORAL | Status: DC

## 2015-09-03 MED ORDER — OXYCODONE-ACETAMINOPHEN 5-325 MG PO TABS: 1.0000 | ORAL_TABLET | ORAL | Status: DC | PRN

## 2015-09-03 MED ORDER — LACTATED RINGERS IV SOLN
INTRAVENOUS | Status: DC
  Administered 2015-09-03 (×2): via INTRAVENOUS
  Filled 2015-09-03: qty 1000

## 2015-09-03 MED ORDER — CEFAZOLIN SODIUM-DEXTROSE 2-4 GM/100ML-% IV SOLN
2.0000 g | INTRAVENOUS | Status: AC
  Administered 2015-09-03: 2 g via INTRAVENOUS
  Filled 2015-09-03: qty 100

## 2015-09-03 MED ORDER — PROPOFOL 10 MG/ML IV BOLUS
INTRAVENOUS | Status: DC | PRN
  Administered 2015-09-03: 150 mg via INTRAVENOUS

## 2015-09-03 MED ORDER — STERILE WATER FOR INJECTION IJ SOLN
INTRAMUSCULAR | Status: AC
  Filled 2015-09-03: qty 10

## 2015-09-03 MED ORDER — HYDROMORPHONE HCL 1 MG/ML IJ SOLN
INTRAMUSCULAR | Status: AC
  Filled 2015-09-03: qty 1

## 2015-09-03 MED ORDER — SODIUM CHLORIDE 0.9 % IR SOLN
Status: DC | PRN
  Administered 2015-09-03: 4000 mL

## 2015-09-03 MED ORDER — DEXAMETHASONE SODIUM PHOSPHATE 10 MG/ML IJ SOLN
INTRAMUSCULAR | Status: AC
  Filled 2015-09-03: qty 1

## 2015-09-03 MED ORDER — MIDAZOLAM HCL 2 MG/2ML IJ SOLN
INTRAMUSCULAR | Status: DC | PRN
  Administered 2015-09-03: 1 mg via INTRAVENOUS

## 2015-09-03 MED ORDER — LIDOCAINE HCL (CARDIAC) 20 MG/ML IV SOLN
INTRAVENOUS | Status: DC | PRN
  Administered 2015-09-03: 50 mg via INTRAVENOUS

## 2015-09-03 MED ORDER — GLYCOPYRROLATE 0.2 MG/ML IJ SOLN
INTRAMUSCULAR | Status: AC
  Filled 2015-09-03: qty 1

## 2015-09-03 MED ORDER — LIDOCAINE HCL (CARDIAC) 20 MG/ML IV SOLN
INTRAVENOUS | Status: AC
  Filled 2015-09-03: qty 5

## 2015-09-03 MED ORDER — DIATRIZOATE MEGLUMINE 30 % UR SOLN
URETHRAL | Status: DC | PRN
  Administered 2015-09-03: 16 mL

## 2015-09-03 MED ORDER — OXYCODONE-ACETAMINOPHEN 5-325 MG PO TABS
ORAL_TABLET | ORAL | Status: AC
  Filled 2015-09-03: qty 1

## 2015-09-03 MED ORDER — DEXAMETHASONE SODIUM PHOSPHATE 4 MG/ML IJ SOLN
INTRAMUSCULAR | Status: DC | PRN
  Administered 2015-09-03: 5 mg via INTRAVENOUS

## 2015-09-03 MED ORDER — PROPOFOL 10 MG/ML IV BOLUS
INTRAVENOUS | Status: AC
  Filled 2015-09-03: qty 40

## 2015-09-03 SURGICAL SUPPLY — 45 items
ADAPTER CATH URET PLST 4-6FR (CATHETERS) IMPLANT
ADPR CATH URET STRL DISP 4-6FR (CATHETERS)
BAG DRAIN URO-CYSTO SKYTR STRL (DRAIN) ×2 IMPLANT
BAG DRN UROCATH (DRAIN) ×1
BASKET LASER NITINOL 1.9FR (BASKET) IMPLANT
BASKET STNLS GEMINI 4WIRE 3FR (BASKET) IMPLANT
BASKET ZERO TIP NITINOL 2.4FR (BASKET) ×1 IMPLANT
BSKT STON RTRVL 120 1.9FR (BASKET)
BSKT STON RTRVL GEM 120X11 3FR (BASKET)
BSKT STON RTRVL ZERO TP 2.4FR (BASKET) ×1
CANISTER SUCT LVC 12 LTR MEDI- (MISCELLANEOUS) IMPLANT
CATH INTERMIT  6FR 70CM (CATHETERS) ×1 IMPLANT
CATH URET 5FR 28IN CONE TIP (BALLOONS) ×1
CATH URET 5FR 28IN OPEN ENDED (CATHETERS) IMPLANT
CATH URET 5FR 70CM CONE TIP (BALLOONS) IMPLANT
CATH URET DUAL LUMEN 6-10FR 50 (CATHETERS) IMPLANT
CLOTH BEACON ORANGE TIMEOUT ST (SAFETY) ×2 IMPLANT
ELECT REM PT RETURN 9FT ADLT (ELECTROSURGICAL)
ELECTRODE REM PT RTRN 9FT ADLT (ELECTROSURGICAL) IMPLANT
FIBER LASER TRAC TIP (UROLOGICAL SUPPLIES) ×1 IMPLANT
GLOVE BIO SURGEON STRL SZ7.5 (GLOVE) ×2 IMPLANT
GLOVE BIOGEL PI IND STRL 7.5 (GLOVE) IMPLANT
GLOVE BIOGEL PI INDICATOR 7.5 (GLOVE) ×3
GLOVE SURG SS PI 7.5 STRL IVOR (GLOVE) ×1 IMPLANT
GOWN STRL REUS W/ TWL LRG LVL3 (GOWN DISPOSABLE) ×1 IMPLANT
GOWN STRL REUS W/ TWL XL LVL3 (GOWN DISPOSABLE) ×1 IMPLANT
GOWN STRL REUS W/TWL LRG LVL3 (GOWN DISPOSABLE) ×3 IMPLANT
GOWN STRL REUS W/TWL XL LVL3 (GOWN DISPOSABLE) ×2
GUIDEWIRE 0.038 PTFE COATED (WIRE) IMPLANT
GUIDEWIRE ANG ZIPWIRE 038X150 (WIRE) ×1 IMPLANT
GUIDEWIRE STR DUAL SENSOR (WIRE) ×2 IMPLANT
IV NS 1000ML (IV SOLUTION) ×2
IV NS 1000ML BAXH (IV SOLUTION) IMPLANT
IV NS IRRIG 3000ML ARTHROMATIC (IV SOLUTION) ×3 IMPLANT
KIT BALLIN UROMAX 15FX10 (LABEL) IMPLANT
KIT BALLN UROMAX 15FX4 (MISCELLANEOUS) IMPLANT
KIT BALLN UROMAX 26 75X4 (MISCELLANEOUS)
KIT ROOM TURNOVER WOR (KITS) ×2 IMPLANT
MANIFOLD NEPTUNE II (INSTRUMENTS) ×1 IMPLANT
PACK CYSTO (CUSTOM PROCEDURE TRAY) ×2 IMPLANT
SET HIGH PRES BAL DIL (LABEL)
SHEATH ACCESS URETERAL 38CM (SHEATH) IMPLANT
STENT URET 6FRX26 CONTOUR (STENTS) ×1 IMPLANT
SYRINGE IRR TOOMEY STRL 70CC (SYRINGE) IMPLANT
TUBE CONNECTING 12X1/4 (SUCTIONS) IMPLANT

## 2015-09-03 NOTE — Anesthesia Postprocedure Evaluation (Signed)
Anesthesia Post Note  Patient: Woody SellerJapheth Spallone  Procedure(s) Performed: Procedure(s) (LRB): RIGHT URETEROSCOPY/HOLMIUM LASER LITHOTRIPSY/STENT PLACEMENT (Right) HOLMIUM LASER APPLICATION (Right) CYSTOSCOPY WITH RETROGRADE PYELOGRAM (Right) STONE EXTRACTION WITH BASKET (Right)  Patient location during evaluation: PACU Anesthesia Type: General Level of consciousness: awake and alert Pain management: pain level controlled Vital Signs Assessment: post-procedure vital signs reviewed and stable Respiratory status: spontaneous breathing, nonlabored ventilation, respiratory function stable and patient connected to nasal cannula oxygen Cardiovascular status: blood pressure returned to baseline and stable Postop Assessment: no signs of nausea or vomiting Anesthetic complications: no    Last Vitals:  Filed Vitals:   09/03/15 0915 09/03/15 0930  BP: 132/88 140/87  Pulse: 73 97  Temp:    Resp: 17 14    Last Pain:  Filed Vitals:   09/03/15 0935  PainSc: 1                  Britten Seyfried DAVID

## 2015-09-03 NOTE — Op Note (Signed)
Preoperative diagnosis: Right distal ureteral stone Postoperative diagnosis: Same  Procedure: Cystoscopy with right retrograde pyelogram, right ureteroscopy with holmium laser lithotripsy, stone basket extraction and ureteral stent placement  Surgeon: Mena GoesEskridge Anesthesia: Gen.  Indication for procedure: 35 year old with persistent stone following right shockwave lithotripsy. He presents for staged procedure.  Findings: On exam under anesthesia the penis was circumcised and without mass or lesion. The testicles were descended bilaterally and palpably normal with some atrophy of the right testicle.  On cystoscopy the urethra and prostatic urethra were unremarkable. The bladder was unremarkable. There were no stone or foreign bodies in the bladder. The right ureteral orifice and intramural ureter rather "heaped up" consistent with stone passage and there was a peculiar fold in the mucosa lateral to the right ureteral orifice which initially appeared like a duplicated system but was blind-ending. The right ureteral orifice was not easy to cannulate for the retrograde or with the wire and seem to swing out more lateral than normal.  Right retrograde pyelogram-this outlined a filling defect in the right distal ureter consistent with the stone with an otherwise normal ureter and collecting system visualized. There was a single ureter single collecting system unit.  On ureteroscopy the stone was located in the right distal ureter.  Description of procedure: After consent was obtained patient brought to the operating room. After adequate anesthesia he is placed in lithotomy position and prepped and draped in the usual sterile fashion. The cystoscope was passed per urethra. I could not cannulate the right ureteral orifice with a 6 [place] open-ended catheter and used to cone-tipped catheter. Retrograde injection of contrast was performed after cannulating the right ureteral orifice. Also inject contrast into  the mucosal fold laterally but this was blind-ending. A sensor wire was advanced into the collecting system and the scope removed. A semirigid ureteroscope was advanced into the right distal ureter with the 200  laser fiber was used to fragment the stone. It was quite dense. 3 main pieces remained and these were extracted and drop in the bladder without difficulty. On final inspection up toward the iliacs no other stone fragments remained. The scope was removed. The wire was backloaded on the cystoscope and a 6 x 26 and ureter stent was advanced. The wire was removed with a good coil seen in the kidney and a good coil in the bladder. The bladder was drained and the scope removed. I left a string on the stent. Patient was awakened taken to recovery room in stable condition.  Complications: None  Blood loss: Minimal  Specimens: None  Drains: 6 x 26 cm right ureteral stent with string

## 2015-09-03 NOTE — Discharge Instructions (Addendum)
[facility] [phone] Post Ureteroscopy With or Without Stent Instructions  Definitions:  Ureter: The duct that transports urine from the kidney to the bladder. Stent:   A plastic hollow tube that is placed into the ureter, from the kidney to the                 bladder to prevent the ureter from swelling shut.  GENERAL INSTRUCTIONS:  Despite the fact that no skin incisions were used, the area around the ureter and bladder is raw and irritated. The stent is a foreign body which will further irritate the bladder wall. This irritation is manifested by increased frequency of urination, both day and night, and by an increase in the urge to urinate. In some, the urge to urinate is present almost always. Sometimes the urge is strong enough that you may not be able to stop yourself from urinating. The only real cure is to remove the stent and then give time for the bladder wall to heal which can't be done until the danger of the ureter swelling shut has passed, which varies.  You may see some blood in your urine while the stent is in place and a few days afterwards. Do not be alarmed, even if the urine was clear for a while. Get off your feet and drink lots of fluids until clearing occurs. If you start to pass clots or don't improve, call us.  DIET: You may return to your normal diet immediately. Because of the raw surface of your bladder, alcohol, spicy foods, acid type foods and drinks with caffeine may cause irritation or frequency and should be used in moderation. To keep your urine flowing freely and to avoid constipation, drink plenty of fluids during the day ( 8-10 glasses ). Tip: Avoid cranberry juice because it is very acidic.  ACTIVITY: Your physical activity doesn't need to be restricted. However, if you are very active, you may see some blood in your urine. We suggest that you reduce your activity under these circumstances until the bleeding has stopped.  BOWELS: It is  important to keep your bowels regular during the postoperative period. Straining with bowel movements can cause bleeding. A bowel movement every other day is reasonable. Use a mild laxative if needed, such as Milk of Magnesia 2-3 tablespoons, or 2 Dulcolax tablets. Call if you continue to have problems. If you have been taking narcotics for pain, before, during or after your surgery, you may be constipated. Take a laxative if necessary.   MEDICATION: You should resume your pre-surgery medications unless told not to. In addition you will often be given an antibiotic to prevent infection. These should be taken as prescribed until the bottles are finished unless you are having an unusual reaction to one of the drugs.  PROBLEMS YOU SHOULD REPORT TO [place]:  Fevers over 100.5 Fahrenheit.  Heavy bleeding, or clots ( See above notes about blood in urine ).  Inability to urinate.  Drug reactions ( hives, rash, nausea, vomiting, diarrhea ).  Severe burning or pain with urination that is not improving.  FOLLOW-UP: You will need a follow-up appointment to monitor your progress. Call for this appointment at the number listed above. Usually the first appointment will be about three to fourteen days after your surgery.  REMOVE stent Friday morning -09/07/2015  Post Anesthesia Home Care Instructions  Activity: Get plenty of rest for the remainder of the day. A responsible adult should stay with you for 24 hours following the  procedure.  For the next 24 hours, DO NOT: -Drive a car -Advertising copywriterperate machinery -Drink alcoholic beverages -Take any medication unless instructed by your physician -Make any legal decisions or sign important papers.  Meals: Start with liquid foods such as gelatin or soup. Progress to regular foods as tolerated. Avoid greasy, spicy, heavy foods. If nausea and/or vomiting occur, drink only clear liquids until the nausea and/or vomiting subsides. Call your physician if vomiting  continues.  Special Instructions/Symptoms: Your throat may feel dry or sore from the anesthesia or the breathing tube placed in your throat during surgery. If this causes discomfort, gargle with warm salt water. The discomfort should disappear within 24 hours.  If you had a scopolamine patch placed behind your ear for the management of post- operative nausea and/or vomiting:  1. The medication in the patch is effective for 72 hours, after which it should be removed.  Wrap patch in a tissue and discard in the trash. Wash hands thoroughly with soap and water. 2. You may remove the patch earlier than 72 hours if you experience unpleasant side effects which may include dry mouth, dizziness or visual disturbances. 3. Avoid touching the patch. Wash your hands with soap and water after contact with the patch.

## 2015-09-03 NOTE — Interval H&P Note (Signed)
History and Physical Interval Note:  09/03/2015 7:28 AM  Willie Simpson  has presented today for surgery, with the diagnosis of right ureteral stone  The various methods of treatment have been discussed with the patient and family. After consideration of risks, benefits and other options for treatment, the patient has consented to  Procedure(s): RIGHT URETEROSCOPY/HOLMIUM LASER LITHOTRIPSY/STENT PLACEMENT (Right) HOLMIUM LASER APPLICATION (Right) as a surgical intervention .  The patient's history has been reviewed, patient examined, no change in status, stable for surgery.  I have reviewed the patient's chart and labs.  Questions were answered to the patient's satisfaction.  He has not seen a stone pass. He has Right flank pain last night and felt nauseated this AM. No dysuria or fever. Discussed he may need a staged procedure and a stent.    [name]

## 2015-09-03 NOTE — Anesthesia Preprocedure Evaluation (Signed)
Anesthesia Evaluation  Patient identified by MRN, date of birth, ID band Patient awake    Reviewed: Allergy & Precautions, NPO status , Patient's Chart, lab work & pertinent test results  Airway Mallampati: I  TM Distance: >3 FB Neck ROM: Full    Dental   Pulmonary    Pulmonary exam normal        Cardiovascular hypertension, Pt. on medications Normal cardiovascular exam     Neuro/Psych    GI/Hepatic   Endo/Other    Renal/GU      Musculoskeletal   Abdominal   Peds  Hematology   Anesthesia Other Findings   Reproductive/Obstetrics                             Anesthesia Physical Anesthesia Plan  ASA: II  Anesthesia Plan: General   Post-op Pain Management:    Induction: Intravenous  Airway Management Planned: LMA  Additional Equipment:   Intra-op Plan:   Post-operative Plan: Extubation in OR  Informed Consent: I have reviewed the patients History and Physical, chart, labs and discussed the procedure including the risks, benefits and alternatives for the proposed anesthesia with the patient or authorized representative who has indicated his/her understanding and acceptance.     Plan Discussed with: CRNA and Surgeon  Anesthesia Plan Comments:         Anesthesia Quick Evaluation  

## 2015-09-03 NOTE — Anesthesia Procedure Notes (Signed)
Procedure Name: LMA Insertion Date/Time: 09/03/2015 7:40 AM Performed by: [name], Willie Pirie Pre-anesthesia Checklist: Patient identified, Emergency Drugs available and Suction available Patient Re-evaluated:Patient Re-evaluated prior to inductionOxygen Delivery Method: Circle system utilized Preoxygenation: Pre-oxygenation with 100% oxygen Intubation Type: IV induction Ventilation: Mask ventilation without difficulty LMA: LMA inserted LMA Size: 4.0 Laser Tube: Cuffed inflated with minimal occlusive pressure - saline Number of attempts: 1 Placement Confirmation: positive ETCO2 and breath sounds checked- equal and bilateral Dental Injury: Teeth and Oropharynx as per pre-operative assessment

## 2015-09-03 NOTE — Transfer of Care (Signed)
Immediate Anesthesia Transfer of Care Note  Patient: Willie Simpson  Procedure(s) Performed: Procedure(s): RIGHT URETEROSCOPY/HOLMIUM LASER LITHOTRIPSY/STENT PLACEMENT (Right) HOLMIUM LASER APPLICATION (Right) CYSTOSCOPY WITH RETROGRADE PYELOGRAM (Right) STONE EXTRACTION WITH BASKET (Right)  Patient Location: PACU  Anesthesia Type:General  Level of Consciousness: awake, alert , oriented and patient cooperative  Airway & Oxygen Therapy: Patient Spontanous Breathing and Patient connected to nasal cannula oxygen  Post-op Assessment: Report given to RN and Post -op Vital signs reviewed and stable  Post vital signs: Reviewed and stable  Last Vitals:  Filed Vitals:   09/03/15 0608 09/03/15 0830  BP: 151/88   Pulse: 83   Temp: 37 C 36.8 C  Resp: 16     Last Pain: There were no vitals filed for this visit.    Patients Stated Pain Goal: 6 (09/03/15 [id number])  Complications: No apparent anesthesia complications

## 2015-09-06 ENCOUNTER — Encounter (HOSPITAL_BASED_OUTPATIENT_CLINIC_OR_DEPARTMENT_OTHER): Payer: Self-pay | Admitting: Urology

## 2015-12-16 DIAGNOSIS — J069 Acute upper respiratory infection, unspecified: Secondary | ICD-10-CM | POA: Diagnosis not present

## 2016-03-02 DIAGNOSIS — A084 Viral intestinal infection, unspecified: Secondary | ICD-10-CM | POA: Diagnosis not present

## 2016-03-19 DIAGNOSIS — Z87442 Personal history of urinary calculi: Secondary | ICD-10-CM | POA: Diagnosis not present

## 2016-03-19 DIAGNOSIS — R1032 Left lower quadrant pain: Secondary | ICD-10-CM | POA: Diagnosis not present

## 2016-04-02 DIAGNOSIS — N201 Calculus of ureter: Secondary | ICD-10-CM | POA: Diagnosis not present

## 2016-04-02 DIAGNOSIS — R1084 Generalized abdominal pain: Secondary | ICD-10-CM | POA: Diagnosis not present

## 2016-04-02 DIAGNOSIS — Z87442 Personal history of urinary calculi: Secondary | ICD-10-CM | POA: Diagnosis not present

## 2016-04-13 ENCOUNTER — Other Ambulatory Visit: Payer: Self-pay | Admitting: Gastroenterology

## 2016-04-13 DIAGNOSIS — K921 Melena: Secondary | ICD-10-CM | POA: Diagnosis not present

## 2016-04-13 DIAGNOSIS — R109 Unspecified abdominal pain: Secondary | ICD-10-CM | POA: Diagnosis not present

## 2016-04-13 DIAGNOSIS — R112 Nausea with vomiting, unspecified: Secondary | ICD-10-CM | POA: Diagnosis not present

## 2016-04-13 DIAGNOSIS — K219 Gastro-esophageal reflux disease without esophagitis: Secondary | ICD-10-CM | POA: Diagnosis not present

## 2016-04-17 ENCOUNTER — Ambulatory Visit
Admission: RE | Admit: 2016-04-17 | Discharge: 2016-04-17 | Disposition: A | Discharge status: Home / Self Care | Payer: BLUE CROSS/BLUE SHIELD | Source: Ambulatory Visit | Attending: Gastroenterology | Admitting: Gastroenterology

## 2016-04-17 DIAGNOSIS — R1032 Left lower quadrant pain: Secondary | ICD-10-CM | POA: Diagnosis not present

## 2016-04-17 DIAGNOSIS — R109 Unspecified abdominal pain: Secondary | ICD-10-CM

## 2016-04-17 MED ORDER — IOPAMIDOL (ISOVUE-300) INJECTION 61%
100.0000 mL | Freq: Once | INTRAVENOUS | Status: AC | PRN
  Administered 2016-04-17: 100 mL via INTRAVENOUS

## 2016-04-23 DIAGNOSIS — K219 Gastro-esophageal reflux disease without esophagitis: Secondary | ICD-10-CM | POA: Diagnosis not present

## 2016-04-23 DIAGNOSIS — K2990 Gastroduodenitis, unspecified, without bleeding: Secondary | ICD-10-CM | POA: Diagnosis not present

## 2016-04-23 DIAGNOSIS — K317 Polyp of stomach and duodenum: Secondary | ICD-10-CM | POA: Diagnosis not present

## 2016-04-23 DIAGNOSIS — K2930 Chronic superficial gastritis without bleeding: Secondary | ICD-10-CM | POA: Diagnosis not present

## 2016-04-23 DIAGNOSIS — K921 Melena: Secondary | ICD-10-CM | POA: Diagnosis not present

## 2016-04-30 DIAGNOSIS — K2990 Gastroduodenitis, unspecified, without bleeding: Secondary | ICD-10-CM | POA: Diagnosis not present

## 2016-04-30 DIAGNOSIS — K317 Polyp of stomach and duodenum: Secondary | ICD-10-CM | POA: Diagnosis not present

## 2016-04-30 DIAGNOSIS — K2930 Chronic superficial gastritis without bleeding: Secondary | ICD-10-CM | POA: Diagnosis not present

## 2016-04-30 DIAGNOSIS — K219 Gastro-esophageal reflux disease without esophagitis: Secondary | ICD-10-CM | POA: Diagnosis not present

## 2016-05-11 DIAGNOSIS — R140 Abdominal distension (gaseous): Secondary | ICD-10-CM | POA: Diagnosis not present

## 2016-05-11 DIAGNOSIS — R194 Change in bowel habit: Secondary | ICD-10-CM | POA: Diagnosis not present

## 2016-05-11 DIAGNOSIS — R1032 Left lower quadrant pain: Secondary | ICD-10-CM | POA: Diagnosis not present

## 2016-05-11 DIAGNOSIS — K625 Hemorrhage of anus and rectum: Secondary | ICD-10-CM | POA: Diagnosis not present

## 2016-05-28 DIAGNOSIS — K625 Hemorrhage of anus and rectum: Secondary | ICD-10-CM | POA: Diagnosis not present

## 2016-05-28 DIAGNOSIS — K648 Other hemorrhoids: Secondary | ICD-10-CM | POA: Diagnosis not present

## 2016-05-28 DIAGNOSIS — K529 Noninfective gastroenteritis and colitis, unspecified: Secondary | ICD-10-CM | POA: Diagnosis not present

## 2016-05-28 DIAGNOSIS — K5730 Diverticulosis of large intestine without perforation or abscess without bleeding: Secondary | ICD-10-CM | POA: Diagnosis not present

## 2016-06-02 DIAGNOSIS — K625 Hemorrhage of anus and rectum: Secondary | ICD-10-CM | POA: Diagnosis not present

## 2016-06-02 DIAGNOSIS — K529 Noninfective gastroenteritis and colitis, unspecified: Secondary | ICD-10-CM | POA: Diagnosis not present

## 2017-03-22 DIAGNOSIS — Z23 Encounter for immunization: Secondary | ICD-10-CM | POA: Diagnosis not present

## 2017-03-22 DIAGNOSIS — Z0000 Encounter for general adult medical examination without abnormal findings: Secondary | ICD-10-CM | POA: Diagnosis not present

## 2017-03-22 DIAGNOSIS — I10 Essential (primary) hypertension: Secondary | ICD-10-CM | POA: Diagnosis not present

## 2017-03-23 DIAGNOSIS — J069 Acute upper respiratory infection, unspecified: Secondary | ICD-10-CM | POA: Diagnosis not present

## 2017-03-23 DIAGNOSIS — B349 Viral infection, unspecified: Secondary | ICD-10-CM | POA: Diagnosis not present

## 2017-03-23 DIAGNOSIS — R509 Fever, unspecified: Secondary | ICD-10-CM | POA: Diagnosis not present

## 2017-05-24 ENCOUNTER — Other Ambulatory Visit: Payer: Self-pay

## 2017-05-24 ENCOUNTER — Ambulatory Visit (HOSPITAL_COMMUNITY)
Admission: EM | Admit: 2017-05-24 | Discharge: 2017-05-24 | Disposition: A | Discharge status: Home / Self Care | Payer: BLUE CROSS/BLUE SHIELD | Attending: Urgent Care | Admitting: Urgent Care

## 2017-05-24 ENCOUNTER — Ambulatory Visit (INDEPENDENT_AMBULATORY_CARE_PROVIDER_SITE_OTHER): Payer: BLUE CROSS/BLUE SHIELD

## 2017-05-24 ENCOUNTER — Encounter (HOSPITAL_COMMUNITY): Payer: Self-pay

## 2017-05-24 DIAGNOSIS — W108XXA Fall (on) (from) other stairs and steps, initial encounter: Secondary | ICD-10-CM | POA: Diagnosis not present

## 2017-05-24 DIAGNOSIS — W19XXXA Unspecified fall, initial encounter: Secondary | ICD-10-CM

## 2017-05-24 DIAGNOSIS — S62642A Nondisplaced fracture of proximal phalanx of right middle finger, initial encounter for closed fracture: Secondary | ICD-10-CM | POA: Diagnosis not present

## 2017-05-24 DIAGNOSIS — M545 Low back pain, unspecified: Secondary | ICD-10-CM

## 2017-05-24 DIAGNOSIS — S92515A Nondisplaced fracture of proximal phalanx of left lesser toe(s), initial encounter for closed fracture: Secondary | ICD-10-CM | POA: Diagnosis not present

## 2017-05-24 DIAGNOSIS — M79675 Pain in left toe(s): Secondary | ICD-10-CM | POA: Diagnosis not present

## 2017-05-24 MED ORDER — CYCLOBENZAPRINE HCL 7.5 MG PO TABS: 7.5000 mg | ORAL_TABLET | Freq: Every evening | ORAL | 0 refills | Status: DC | PRN

## 2017-05-24 MED ORDER — TRAMADOL HCL 50 MG PO TABS: 50.0000 mg | ORAL_TABLET | Freq: Four times a day (QID) | ORAL | 0 refills | Status: DC | PRN

## 2017-05-24 NOTE — ED Provider Notes (Signed)
  MRN: [id number] DOB: [birth year]  Subjective:   Willie Simpson is a 37 y.o. male presenting for suffering a fall down the steps this morning. He fell about 5 steps, has had constant, achy tightness type pain of his low back. Also reports severe pain of his left pinky toe, swelling, bruising. Denies incontinence, weakness, hematuria, radicular pain, numbness or tingling. He has not tried any medications for pain relief.  Takes amlodipine, lis-HCTZ. He has No Known Allergies.    Past Medical History:  Diagnosis Date  . History of kidney stones   . Hypertension   . Right ureteral stone   . Urgency of urination      Past Surgical History:  Procedure Laterality Date  . COLONOSCOPY  08-22-2007  . CYSTOSCOPY W/ RETROGRADES Right 09/03/2015   Procedure: CYSTOSCOPY WITH RETROGRADE PYELOGRAM;  Surgeon: Jerilee FieldMatthew Eskridge, MD;  [facility] [place];  Service: Urology;  Laterality: Right;  . CYSTOSCOPY/URETEROSCOPY/HOLMIUM LASER/STENT PLACEMENT Right 09/03/2015   Procedure: RIGHT URETEROSCOPY/HOLMIUM LASER LITHOTRIPSY/STENT PLACEMENT;  Surgeon: Jerilee FieldMatthew Eskridge, MD;  [facility] [place];  Service: Urology;  Laterality: Right;  . EXTRACORPOREAL SHOCK WAVE LITHOTRIPSY Right 06-27-2015  . HOLMIUM LASER APPLICATION Right 09/03/2015   Procedure: HOLMIUM LASER APPLICATION;  Surgeon: Jerilee FieldMatthew Eskridge, MD;  [facility] [place];  Service: Urology;  Laterality: Right;  . ORCHIOPEXY Right age 37   undescended testis  . STONE EXTRACTION WITH BASKET Right 09/03/2015   Procedure: STONE EXTRACTION WITH BASKET;  Surgeon: Jerilee FieldMatthew Eskridge, MD;  [facility] [place];  Service: Urology;  Laterality: Right;    Objective:   Vitals: BP (!) 150/97   Pulse 82   Temp 98.6 F (37 C)   Resp 19   Ht 5\' 5"  (1.651 m)   Wt 165 lb (74.8 kg)   SpO2 100%   BMI 27.46 kg/m   Physical Exam  Constitutional: He is oriented to person, place, and time. He appears  well-developed and well-nourished.  Cardiovascular: Normal rate.  Pulmonary/Chest: Effort normal.  Musculoskeletal:       Lumbar back: He exhibits decreased range of motion (flexion, extension) and tenderness (flank and paraspinal muscles). He exhibits no bony tenderness, no swelling, no edema, no deformity and no spasm.       Left foot: There is tenderness (of left 5th toe with associated slight ecchymosis) and swelling (trace). There is normal range of motion, normal capillary refill, no crepitus, no deformity and no laceration.  Neurological: He is alert and oriented to person, place, and time.   Dg Foot Complete Left  Result Date: 05/24/2017 CLINICAL DATA:  Left foot pain after fall today. EXAM: LEFT FOOT - COMPLETE 3+ VIEW COMPARISON:  None. FINDINGS: Nondisplaced oblique fifth proximal phalangeal fracture is noted. No other bony abnormality is noted. Joint spaces are intact. No soft tissue abnormalities noted. IMPRESSION: Nondisplaced fifth proximal phalangeal fracture. Electronically Signed   By: Lupita RaiderJames  Veloso Jr, M.D.   On: 05/24/2017 13:13   Assessment and Plan :   Fall, initial encounter  Acute bilateral low back pain without sciatica  Toe pain, left  Nondisplaced fracture of proximal phalanx of left lesser toe(s), initial encounter for closed fracture  Counseled on management of his toe fracture. Will establish follow up with ortho. In the meantime, use Ultram for severe pain and schedule APAP with ibuprofen. Return-to-clinic precautions discussed, patient verbalized understanding.    Wallis BambergMani, Shanetha Bradham, PA-C 05/24/17 1328

## 2017-05-24 NOTE — ED Triage Notes (Signed)
Pt presents after falling down 5 steps this morning while carrying his baby, complaining of pain in his back and his left foot. Denies any head or neck pain, denies loc.

## 2017-05-24 NOTE — Discharge Instructions (Addendum)
You may take 500mg  Tylenol with ibuprofen 400-600mg  every 6 hours for pain and inflammation. You may use Flexeril to help as a muscle relaxant for your low back.

## 2017-09-16 DIAGNOSIS — R1084 Generalized abdominal pain: Secondary | ICD-10-CM | POA: Diagnosis not present

## 2018-03-28 DIAGNOSIS — I10 Essential (primary) hypertension: Secondary | ICD-10-CM | POA: Diagnosis not present

## 2018-03-28 DIAGNOSIS — Z0000 Encounter for general adult medical examination without abnormal findings: Secondary | ICD-10-CM | POA: Diagnosis not present

## 2018-10-19 DIAGNOSIS — H40013 Open angle with borderline findings, low risk, bilateral: Secondary | ICD-10-CM | POA: Diagnosis not present

## 2019-01-18 IMAGING — CT CT ABD-PELV W/ CM
3 of 5 series · 12 of 36 positions shown, 18 images · IV contrast (READICAT/WATER & [ID] ISOVUE 300)
Comparison: 06/17/2015

CLINICAL DATA: Left lower quadrant pain and bloating. Two month
history of constipation.

EXAM:
CT ABDOMEN AND PELVIS WITH CONTRAST
TECHNIQUE: Multidetector CT imaging of the abdomen and pelvis was performed
using the standard protocol following bolus administration of
intravenous contrast.
CONTRAST:  100mL QTBP8L-UHH IOPAMIDOL (QTBP8L-UHH) INJECTION 61%

[Series 3: abd/pelvis with · axial · 0.70mm/px · z∈[-306,+14]mm · 7 of 86 slices shown, 12 images]
[im 11/86  soft-tissue]
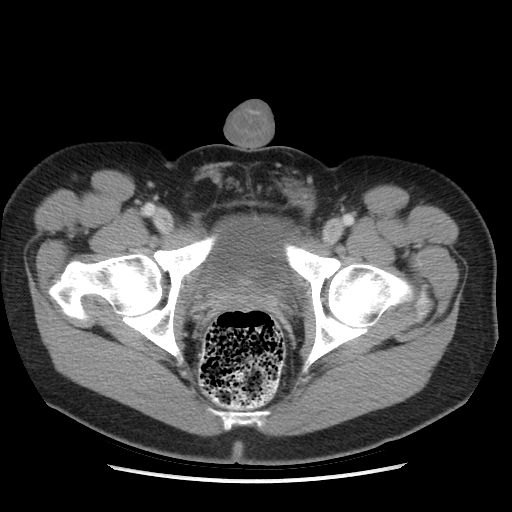
[im 11/86  bone]
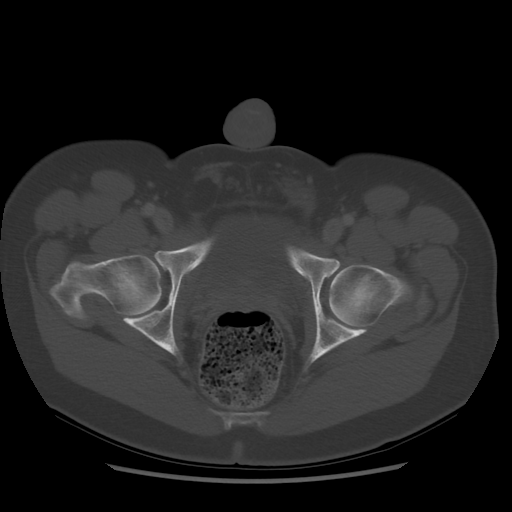
[im 22/86  soft-tissue]
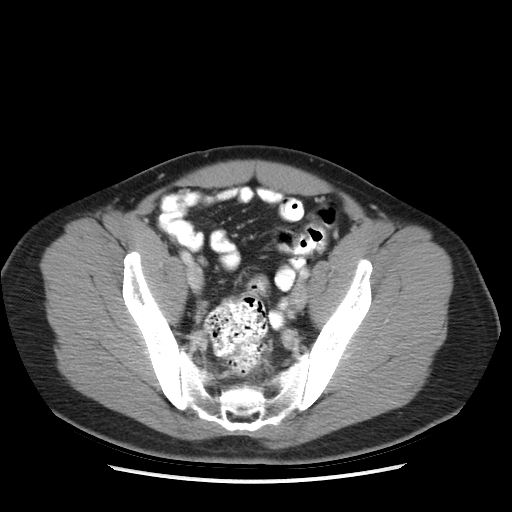
[im 32/86  soft-tissue]
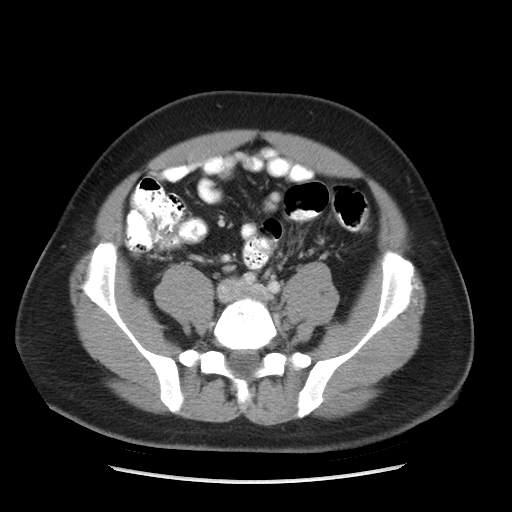
[im 43/86  soft-tissue]
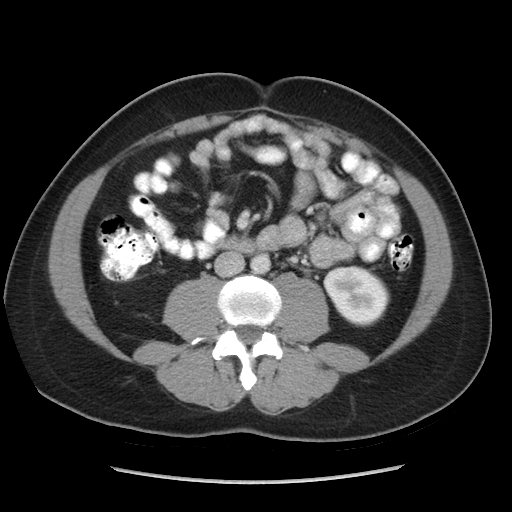
[im 43/86  lung]
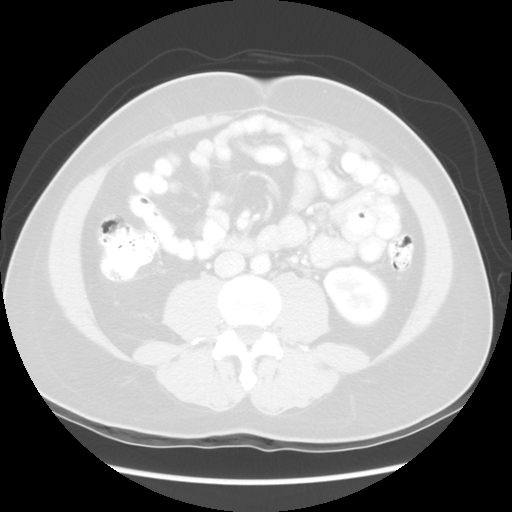
[im 54/86  soft-tissue]
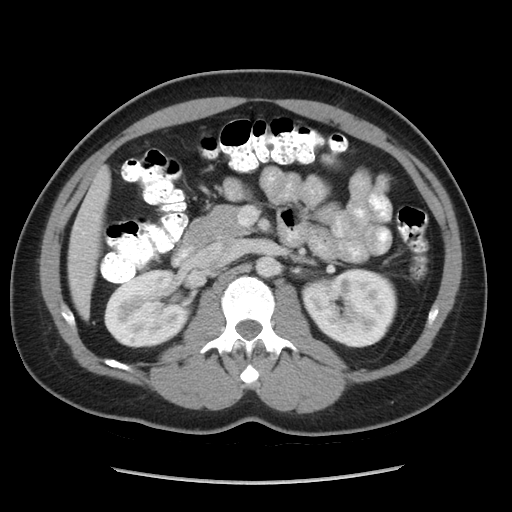
[im 54/86  lung]
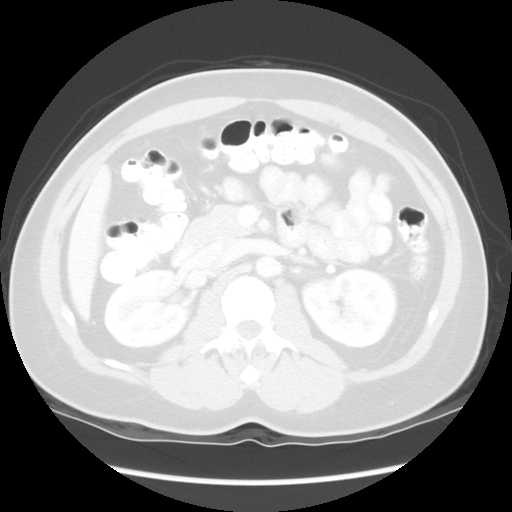
[im 64/86  soft-tissue]
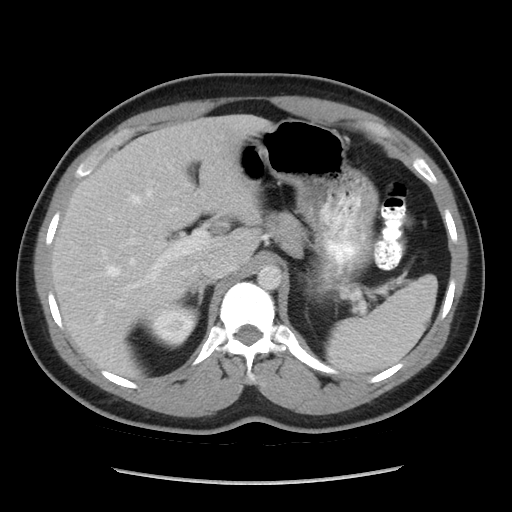
[im 64/86  lung]
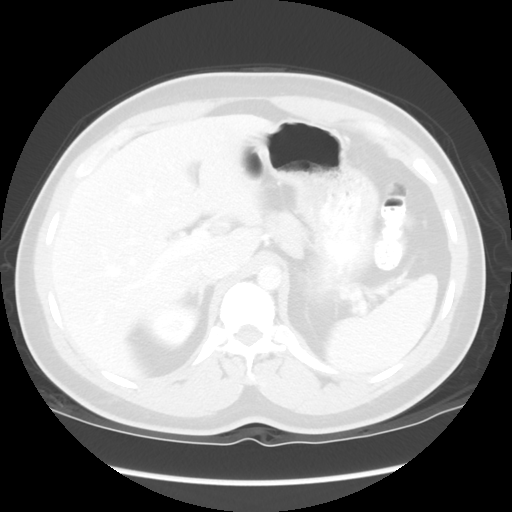
[im 75/86  soft-tissue]
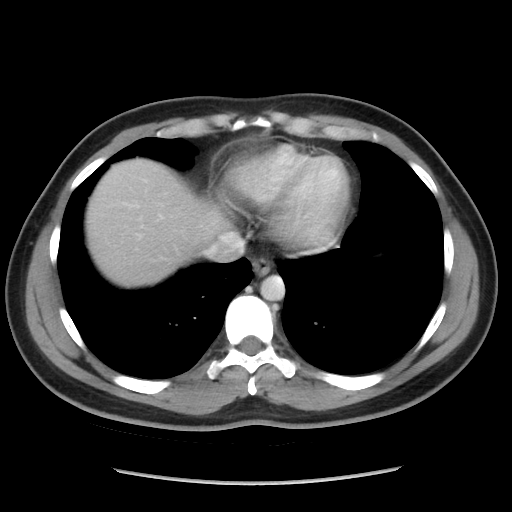
[im 75/86  lung]
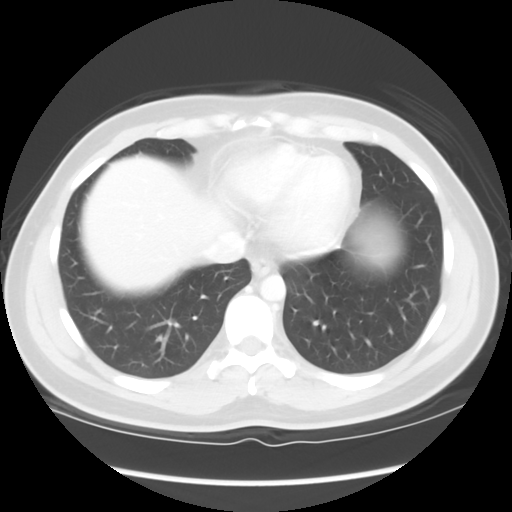

[Series 601: coronal body · coronal · 0.93mm/px · 1 of 117 slices shown, 2 images]
[im 39/117  soft-tissue]
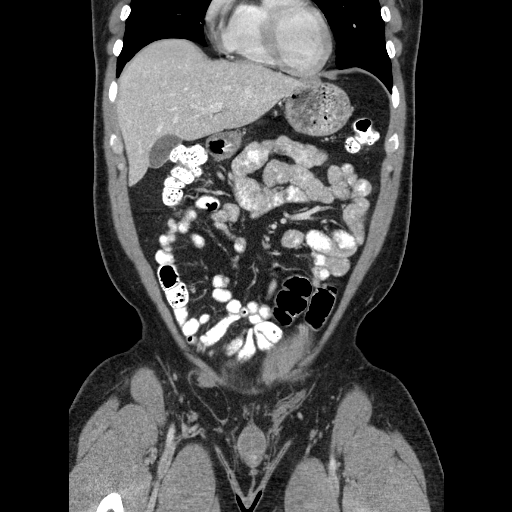
[im 39/117  bone]
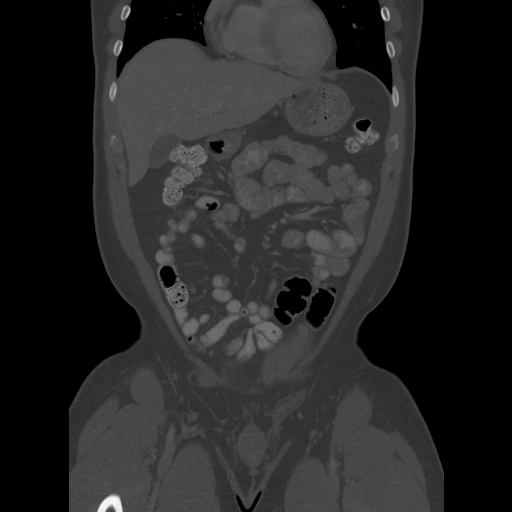

[Series 602: sagittal body · sagittal · 0.93mm/px · 4 of 145 slices shown]
[im 10/145  soft-tissue]
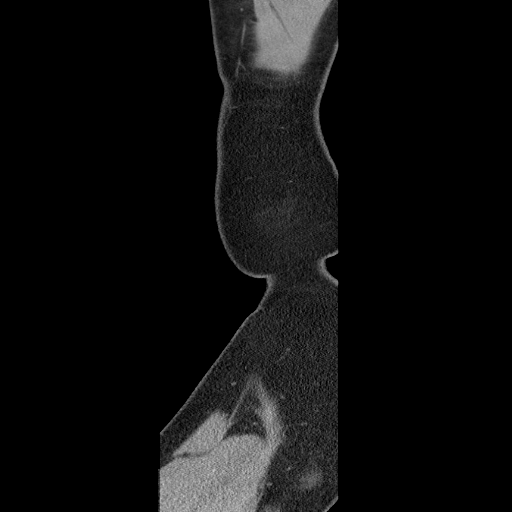
[im 29/145  soft-tissue]
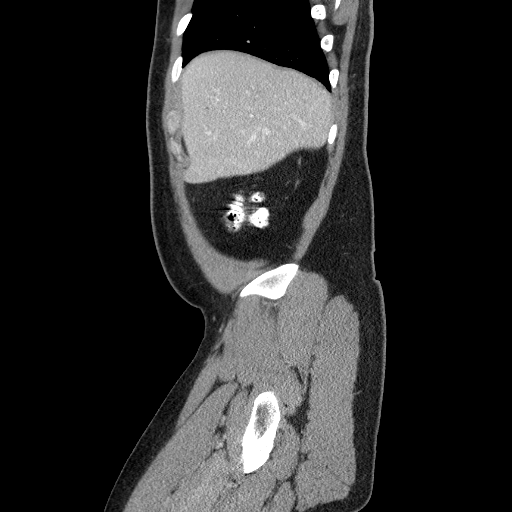
[im 49/145  soft-tissue]
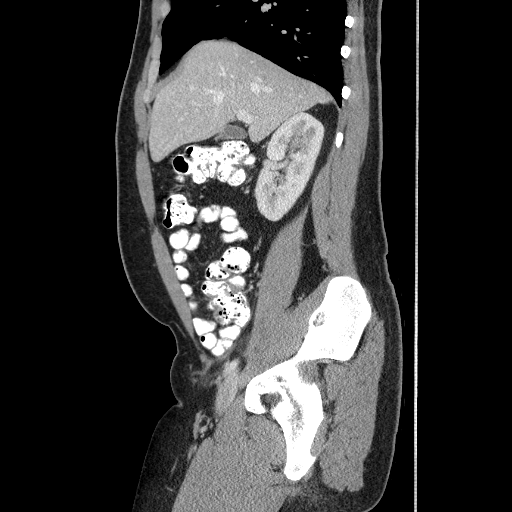
[im 68/145  soft-tissue]
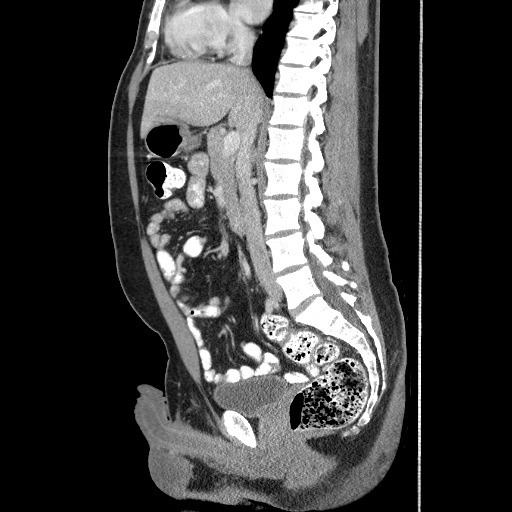

[12 of 36 positions shown; findings below may reference images not displayed]

FINDINGS: Lower chest:  Unremarkable.

Hepatobiliary: No focal abnormality within the liver parenchyma.
There is no evidence for gallstones, gallbladder wall thickening, or
pericholecystic fluid. No intrahepatic or extrahepatic biliary
dilation.

Pancreas: No focal mass lesion. No dilatation of the main duct. No
intraparenchymal cyst. No peripancreatic edema.

Spleen: Stable appearance small hypoenhancing splenic lesions
measuring up to 13 mm in the lower pole.

Adrenals/Urinary Tract: No adrenal nodule or mass. Tiny
hypodensities in the kidneys bilaterally are stable and likely
represent cysts. Increased attenuation of the urine in the left
renal pelvis likely related to early excretion of contrast although
hemorrhage or infectious debris could have this appearance. No
evidence for hydroureter. The urinary bladder appears normal for the
degree of distention.

Stomach/Bowel: Stomach is nondistended. No gastric wall thickening.
No evidence of outlet obstruction. Duodenum is normally positioned
as is the ligament of Treitz. No small bowel wall thickening. No
small bowel dilatation. The terminal ileum is normal. The appendix
is normal. No gross colonic mass. No colonic wall thickening. No
substantial diverticular change.

Vascular/Lymphatic: No abdominal aortic aneurysm. No abdominal
aortic atherosclerotic calcification. There is no gastrohepatic or
hepatoduodenal ligament lymphadenopathy. No intraperitoneal or
retroperitoneal lymphadenopathy. No pelvic sidewall lymphadenopathy.

Reproductive: The prostate gland and seminal vesicles have normal
imaging features.

Other: No intraperitoneal free fluid.

Musculoskeletal: Bone windows reveal no worrisome lytic or sclerotic
osseous lesions.
IMPRESSION: 1. No acute findings in the abdomen or pelvis. Specifically, no
findings to explain the patient's history of left lower quadrant
pain.
2. Increased attenuation identified in the left renal pelvis. This
is probably related to early excretion of contrast, but hemorrhage
or infectious debris could present similarly. Correlation with
urinalysis should prove helpful.

## 2019-04-12 DIAGNOSIS — Z125 Encounter for screening for malignant neoplasm of prostate: Secondary | ICD-10-CM | POA: Diagnosis not present

## 2019-04-12 DIAGNOSIS — Z13220 Encounter for screening for lipoid disorders: Secondary | ICD-10-CM | POA: Diagnosis not present

## 2019-04-12 DIAGNOSIS — Z0000 Encounter for general adult medical examination without abnormal findings: Secondary | ICD-10-CM | POA: Diagnosis not present

## 2019-04-12 DIAGNOSIS — I10 Essential (primary) hypertension: Secondary | ICD-10-CM | POA: Diagnosis not present

## 2019-04-20 DIAGNOSIS — H40013 Open angle with borderline findings, low risk, bilateral: Secondary | ICD-10-CM | POA: Diagnosis not present

## 2019-05-06 ENCOUNTER — Ambulatory Visit: Payer: BLUE CROSS/BLUE SHIELD | Attending: Internal Medicine

## 2019-05-06 DIAGNOSIS — Z23 Encounter for immunization: Secondary | ICD-10-CM

## 2019-05-06 NOTE — Progress Notes (Signed)
   Covid-19 Vaccination Clinic  Name:  Willie Simpson    MRN: [id number] DOB: 03/25/1980  05/06/2019  Willie Simpson was observed post Covid-19 immunization for 15 minutes without incident. He was provided with Vaccine Information Sheet and instruction to access the V-Safe system.   Willie Simpson was instructed to call 911 with any severe reactions post vaccine: Marland Kitchen Difficulty breathing  . Swelling of face and throat  . A fast heartbeat  . A bad rash all over body  . Dizziness and weakness   Immunizations Administered    Name Date Dose VIS Date Route   Pfizer COVID-19 Vaccine 05/06/2019  2:56 PM 0.3 mL 02/03/2019 Intramuscular   Manufacturer: [employer], [employer]   Lot: [id number]   NDC: [address]-2

## 2019-05-31 ENCOUNTER — Ambulatory Visit: Payer: BLUE CROSS/BLUE SHIELD | Attending: Internal Medicine

## 2019-05-31 ENCOUNTER — Ambulatory Visit: Payer: BLUE CROSS/BLUE SHIELD

## 2019-05-31 DIAGNOSIS — Z23 Encounter for immunization: Secondary | ICD-10-CM

## 2019-05-31 NOTE — Progress Notes (Signed)
   Covid-19 Vaccination Clinic  Name:  Willie Simpson    MRN: [id number] DOB: 1980-04-03  05/31/2019  Mr. [name] was observed post Covid-19 immunization for 15 minutes without incident. He was provided with Vaccine Information Sheet and instruction to access the V-Safe system.   Mr. [name] was instructed to call 911 with any severe reactions post vaccine: Marland Kitchen Difficulty breathing  . Swelling of face and throat  . A fast heartbeat  . A bad rash all over body  . Dizziness and weakness   Immunizations Administered    Name Date Dose VIS Date Route   Pfizer COVID-19 Vaccine 05/31/2019  1:17 PM 0.3 mL 02/03/2019 Intramuscular   Manufacturer: [employer], [employer]   Lot: [id number]   NDC: [address]-3

## 2019-08-24 DIAGNOSIS — A084 Viral intestinal infection, unspecified: Secondary | ICD-10-CM | POA: Diagnosis not present

## 2019-09-13 ENCOUNTER — Encounter (HOSPITAL_COMMUNITY): Payer: Self-pay | Admitting: Emergency Medicine

## 2019-09-13 ENCOUNTER — Emergency Department (HOSPITAL_COMMUNITY)
Admission: EM | Admit: 2019-09-13 | Discharge: 2019-09-13 | Disposition: A | Discharge status: Home / Self Care | Payer: BC Managed Care – PPO | Attending: Emergency Medicine | Admitting: Emergency Medicine

## 2019-09-13 ENCOUNTER — Emergency Department (HOSPITAL_COMMUNITY): Payer: BC Managed Care – PPO

## 2019-09-13 ENCOUNTER — Other Ambulatory Visit: Payer: Self-pay

## 2019-09-13 DIAGNOSIS — N23 Unspecified renal colic: Secondary | ICD-10-CM | POA: Diagnosis not present

## 2019-09-13 DIAGNOSIS — R300 Dysuria: Secondary | ICD-10-CM | POA: Diagnosis not present

## 2019-09-13 DIAGNOSIS — R109 Unspecified abdominal pain: Secondary | ICD-10-CM | POA: Diagnosis not present

## 2019-09-13 DIAGNOSIS — N200 Calculus of kidney: Secondary | ICD-10-CM | POA: Diagnosis not present

## 2019-09-13 DIAGNOSIS — N132 Hydronephrosis with renal and ureteral calculous obstruction: Secondary | ICD-10-CM | POA: Diagnosis not present

## 2019-09-13 DIAGNOSIS — I10 Essential (primary) hypertension: Secondary | ICD-10-CM | POA: Diagnosis not present

## 2019-09-13 DIAGNOSIS — K4090 Unilateral inguinal hernia, without obstruction or gangrene, not specified as recurrent: Secondary | ICD-10-CM | POA: Diagnosis not present

## 2019-09-13 DIAGNOSIS — D734 Cyst of spleen: Secondary | ICD-10-CM | POA: Diagnosis not present

## 2019-09-13 LAB — CBC
HCT: 45.5 % (ref 39.0–52.0)
Hemoglobin: 14.2 g/dL (ref 13.0–17.0)
MCH: 27.2 pg (ref 26.0–34.0)
MCHC: 31.2 g/dL (ref 30.0–36.0)
MCV: 87.2 fL (ref 80.0–100.0)
Platelets: 228 10*3/uL (ref 150–400)
RBC: 5.22 MIL/uL (ref 4.22–5.81)
RDW: 13.1 % (ref 11.5–15.5)
WBC: 7.3 10*3/uL (ref 4.0–10.5)
nRBC: 0 % (ref 0.0–0.2)

## 2019-09-13 LAB — URINALYSIS, ROUTINE W REFLEX MICROSCOPIC
Bilirubin Urine: NEGATIVE
Glucose, UA: NEGATIVE mg/dL
Ketones, ur: NEGATIVE mg/dL
Leukocytes,Ua: NEGATIVE
Nitrite: NEGATIVE
Protein, ur: NEGATIVE mg/dL
Specific Gravity, Urine: 1.01 (ref 1.005–1.030)
pH: 6 (ref 5.0–8.0)

## 2019-09-13 LAB — BASIC METABOLIC PANEL
Anion gap: 10 (ref 5–15)
BUN: 11 mg/dL (ref 6–20)
CO2: 26 mmol/L (ref 22–32)
Calcium: 9.8 mg/dL (ref 8.9–10.3)
Chloride: 101 mmol/L (ref 98–111)
Creatinine, Ser: 0.92 mg/dL (ref 0.61–1.24)
GFR calc Af Amer: >60 mL/min (ref >60)
GFR calc non Af Amer: >60 mL/min (ref >60)
Glucose, Bld: 96 mg/dL (ref 70–99)
Potassium: 4.3 mmol/L (ref 3.5–5.1)
Sodium: 137 mmol/L (ref 135–145)

## 2019-09-13 MED ORDER — HYDROCODONE-ACETAMINOPHEN 5-325 MG PO TABS: 1.0000 | ORAL_TABLET | Freq: Four times a day (QID) | ORAL | 0 refills | Status: DC | PRN

## 2019-09-13 MED ORDER — KETOROLAC TROMETHAMINE 60 MG/2ML IM SOLN
60.0000 mg | Freq: Once | INTRAMUSCULAR | Status: AC
  Administered 2019-09-13: 60 mg via INTRAMUSCULAR
  Filled 2019-09-13: qty 2

## 2019-09-13 NOTE — ED Notes (Signed)
Pt. Up to the restroom for urine collection.

## 2019-09-13 NOTE — ED Notes (Signed)
Pt. Given some water and states that his pain has resided quite a bit.

## 2019-09-13 NOTE — Discharge Instructions (Signed)
Use 600 mg ibuprofen and 1000 mg of Tylenol every 6 hours as needed for pain. Stay well-hydrated with water.  Return to the emergency room for fevers, uncontrolled pain, persistent vomiting or new concerns.  Follow-up with specialist as directed if you do not pass the stone. For severe pain take norco or vicodin however realize they have the potential for addiction and it can make you sleepy and has tylenol in it.  No operating machinery while taking.

## 2019-09-13 NOTE — ED Notes (Signed)
Pt. Given a warm blanket and a heat pack for his flank pain.

## 2019-09-13 NOTE — ED Triage Notes (Signed)
Pt presents to ED POV> Pt c/o R flank pain. Pain is a 7/10. Pt also c/o discomfort w/ urinating. Pt reports he has hx of kidney stones and feels like its same.

## 2019-09-13 NOTE — ED Provider Notes (Signed)
[facility] EMERGENCY DEPARTMENT Provider Note   CSN: [id number] Arrival date & time: 09/13/19  0258     History Chief Complaint  Patient presents with  . Flank Pain    Willie Simpson is a 39 y.o. male.  Patient with history of kidney stones, high blood pressure presents with intermittent moderate right mid flank pain.  Patient feels similar to history of kidney stone few years back that he had to have procedure done to remove.  No fever or vomiting.        Past Medical History:  Diagnosis Date  . History of kidney stones   . Hypertension   . Right ureteral stone   . Urgency of urination     Patient Active Problem List   Diagnosis Date Noted  . ILEITIS 08/29/2007  . ANAL OR RECTAL PAIN 08/09/2007  . FECAL OCCULT BLOOD 08/09/2007  . CONSTIPATION 08/08/2007  . HEMOCCULT POSITIVE STOOL 08/08/2007  . SPINA BIFIDA OCCULTA 08/08/2007    Past Surgical History:  Procedure Laterality Date  . COLONOSCOPY  08-22-2007  . CYSTOSCOPY W/ RETROGRADES Right 09/03/2015   Procedure: CYSTOSCOPY WITH RETROGRADE PYELOGRAM;  Surgeon: Jerilee Field, MD;  Location: [facility];  Service: Urology;  Laterality: Right;  . CYSTOSCOPY/URETEROSCOPY/HOLMIUM LASER/STENT PLACEMENT Right 09/03/2015   Procedure: RIGHT URETEROSCOPY/HOLMIUM LASER LITHOTRIPSY/STENT PLACEMENT;  Surgeon: Jerilee Field, MD;  Location: [facility];  Service: Urology;  Laterality: Right;  . EXTRACORPOREAL SHOCK WAVE LITHOTRIPSY Right 06-27-2015  . HOLMIUM LASER APPLICATION Right 09/03/2015   Procedure: HOLMIUM LASER APPLICATION;  Surgeon: Jerilee Field, MD;  Location: [facility];  Service: Urology;  Laterality: Right;  . ORCHIOPEXY Right age 14   undescended testis  . STONE EXTRACTION WITH BASKET Right 09/03/2015   Procedure: STONE EXTRACTION WITH BASKET;  Surgeon: Jerilee Field, MD;  Location: [facility];  Service: Urology;   Laterality: Right;       History reviewed. No pertinent family history.  Social History   Tobacco Use  . Smoking status: Never Smoker  . Smokeless tobacco: Never Used  Substance Use Topics  . Alcohol use: Yes    Comment: social  . Drug use: No    Home Medications Prior to Admission medications   Medication Sig Start Date End Date Taking? Authorizing Provider  amLODipine (NORVASC) 10 MG tablet Take 10 mg by mouth every morning. LUNCH    [provider]  cyclobenzaprine (FEXMID) 7.5 MG tablet Take 1 tablet (7.5 mg total) by mouth at bedtime as needed for muscle spasms. 05/24/17   Wallis Bamberg, PA-C  HYDROcodone-acetaminophen (NORCO) 5-325 MG tablet Take 1-2 tablets by mouth every 6 (six) hours as needed. 09/13/19   Blane Ohara, MD  lisinopril-hydrochlorothiazide (PRINZIDE,ZESTORETIC) 10-12.5 MG tablet Take 1 tablet by mouth every morning. LUNCH    [provider]  Naproxen Sodium (ALEVE) 220 MG CAPS Take 220 mg by mouth every 8 (eight) hours as needed.    [provider]  traMADol (ULTRAM) 50 MG tablet Take 1 tablet (50 mg total) by mouth every 6 (six) hours as needed. 05/24/17   Wallis Bamberg, PA-C    Allergies    Patient has no known allergies.  Review of Systems   Review of Systems  Constitutional: Positive for chills. Negative for fever.  HENT: Negative for congestion.   Eyes: Negative for visual disturbance.  Respiratory: Negative for shortness of breath.   Cardiovascular: Negative for chest pain.  Gastrointestinal: Negative for abdominal pain  and vomiting.  Genitourinary: Positive for flank pain. Negative for dysuria.  Musculoskeletal: Negative for back pain, neck pain and neck stiffness.  Skin: Negative for rash.  Neurological: Negative for light-headedness and headaches.    Physical Exam Updated Vital Signs BP (!) 139/97 (BP Location: Left Arm)   Pulse 80   Temp 98 F (36.7 C) (Temporal)   Resp 16   SpO2 100%   Physical Exam Vitals  and nursing note reviewed.  Constitutional:      Appearance: He is well-developed.  HENT:     Head: Normocephalic and atraumatic.  Eyes:     General:        Right eye: No discharge.        Left eye: No discharge.     Conjunctiva/sclera: Conjunctivae normal.  Neck:     Trachea: No tracheal deviation.  Cardiovascular:     Rate and Rhythm: Normal rate.  Pulmonary:     Effort: Pulmonary effort is normal.  Abdominal:     General: There is no distension.     Palpations: Abdomen is soft.     Tenderness: There is abdominal tenderness (mild right mid abd). There is no guarding.  Musculoskeletal:     Cervical back: Normal range of motion and neck supple.  Skin:    General: Skin is warm.     Findings: No rash.  Neurological:     Mental Status: He is alert and oriented to person, place, and time.     ED Results / Procedures / Treatments   Labs (all labs ordered are listed, but only abnormal results are displayed) Labs Reviewed  URINALYSIS, ROUTINE W REFLEX MICROSCOPIC - Abnormal; Notable for the following components:      Result Value   Color, Urine STRAW (*)    Hgb urine dipstick LARGE (*)    Bacteria, UA RARE (*)    All other components within normal limits  URINE CULTURE  CBC  BASIC METABOLIC PANEL    EKG None  Radiology CT Renal Stone Study  Result Date: 09/13/2019 CLINICAL DATA:  Right flank pain and dysuria. History of prior stone disease. EXAM: CT ABDOMEN AND PELVIS WITHOUT CONTRAST TECHNIQUE: Multidetector CT imaging of the abdomen and pelvis was performed following the standard protocol without IV contrast. COMPARISON:  CT scan 04/17/2016 FINDINGS: Lower chest: The lung bases are clear of acute process. No pleural effusion or pulmonary lesions. The heart is normal in size. No pericardial effusion. The distal esophagus and aorta are unremarkable. Hepatobiliary: No hepatic lesions are identified without contrast. No biliary dilatation. The gallbladder appears normal. No  common bile duct dilatation. Pancreas: No mass, inflammation or ductal dilatation. Spleen: Normal size. Stable small splenic cyst. Adrenals/Urinary Tract: The adrenal glands are normal. The left kidney is unremarkable. No left-sided renal calculi or obstructing ureteral calculi. No renal lesions are identified. The right kidney demonstrates small midpole calculi. There is moderate right-sided hydroureteronephrosis down to an obstructing 3.5 mm right UVJ calculus. No bladder lesions or bladder calculi. Stomach/Bowel: The stomach, duodenum, small bowel and colon are grossly normal without oral contrast. No acute inflammatory changes, mass lesions or obstructive findings. The terminal ileum is normal. The appendix is normal. Vascular/Lymphatic: The aorta is normal in caliber. No atheroscerlotic calcifications. No mesenteric of retroperitoneal mass or adenopathy. Small scattered lymph nodes are noted. Reproductive: The prostate gland and seminal vesicles are unremarkable. Other: No pelvic mass or adenopathy. No free pelvic fluid collections. No inguinal mass or adenopathy. No abdominal wall hernia  or subcutaneous lesions. Small right inguinal hernia containing fat. Musculoskeletal: No significant bony findings. IMPRESSION: 1. 3.5 mm right UVJ calculus causing moderate right-sided hydroureteronephrosis. 2. Small right renal calculi. 3. No other significant abdominal/pelvic findings, mass lesions or adenopathy. Electronically Signed   By: Rudie Meyer M.D.   On: 09/13/2019 06:07    Procedures Procedures (including critical care time)  Medications Ordered in ED Medications  ketorolac (TORADOL) injection 60 mg (has no administration in time range)    ED Course  I have reviewed the triage vital signs and the nursing notes.  Pertinent labs & imaging results that were available during my care of the patient were reviewed by me and considered in my medical decision making (see chart for details).    MDM  Rules/Calculators/A&P                          Patient with history of kidney stone presents with clinical concern for renal colic.  Patient's pain moderate, Toradol ordered for pain.  Discussed ibuprofen and Tylenol and prescription will be written for severe pain discussed risks and benefits of narcotics.  Patient will need a work note.  Urinalysis reviewed showing hemoglobin no significant bacteria or signs of infection.  Blood work showed normal white blood cell count, normal hemoglobin, normal kidney function. CT scan results reviewed showing 3.5 mm UVJ calculus.  Patient stable for outpatient follow-up with urology reasons to return discussed.   Final Clinical Impression(s) / ED Diagnoses Final diagnoses:  Kidney stone  Renal colic on right side    Rx / DC Orders ED Discharge Orders         Ordered    HYDROcodone-acetaminophen (NORCO) 5-325 MG tablet  Every 6 hours PRN     Discontinue  Reprint     09/13/19 0956           Blane Ohara, MD 09/13/19 [id number]

## 2019-09-14 LAB — URINE CULTURE: Culture: NO GROWTH

## 2019-09-20 DIAGNOSIS — Z3009 Encounter for other general counseling and advice on contraception: Secondary | ICD-10-CM | POA: Diagnosis not present

## 2019-09-20 DIAGNOSIS — N202 Calculus of kidney with calculus of ureter: Secondary | ICD-10-CM | POA: Diagnosis not present

## 2019-11-15 DIAGNOSIS — H40013 Open angle with borderline findings, low risk, bilateral: Secondary | ICD-10-CM | POA: Diagnosis not present

## 2020-02-03 DIAGNOSIS — J069 Acute upper respiratory infection, unspecified: Secondary | ICD-10-CM | POA: Diagnosis not present

## 2020-04-17 DIAGNOSIS — Z13220 Encounter for screening for lipoid disorders: Secondary | ICD-10-CM | POA: Diagnosis not present

## 2020-04-17 DIAGNOSIS — Z0000 Encounter for general adult medical examination without abnormal findings: Secondary | ICD-10-CM | POA: Diagnosis not present

## 2020-04-17 DIAGNOSIS — R7309 Other abnormal glucose: Secondary | ICD-10-CM | POA: Diagnosis not present

## 2020-05-10 DIAGNOSIS — N202 Calculus of kidney with calculus of ureter: Secondary | ICD-10-CM | POA: Diagnosis not present

## 2020-05-10 DIAGNOSIS — Z3009 Encounter for other general counseling and advice on contraception: Secondary | ICD-10-CM | POA: Diagnosis not present

## 2020-05-17 ENCOUNTER — Other Ambulatory Visit: Payer: Self-pay | Admitting: Urology

## 2020-05-29 DIAGNOSIS — H40013 Open angle with borderline findings, low risk, bilateral: Secondary | ICD-10-CM | POA: Diagnosis not present

## 2020-06-12 ENCOUNTER — Other Ambulatory Visit: Payer: Self-pay

## 2020-06-12 ENCOUNTER — Encounter (HOSPITAL_BASED_OUTPATIENT_CLINIC_OR_DEPARTMENT_OTHER): Payer: Self-pay | Admitting: Urology

## 2020-06-12 NOTE — Progress Notes (Signed)
Spoke w/ via phone for pre-op interview--- PT Lab needs dos---- Istat and EKG              Lab results------ no COVID test ------ 06-14-2020 @ 1415 Arrive at -------  1215 on 06-18-2020 NPO after MN NO Solid Food.  Clear liquids from MN until---  1115 Med rec completed Medications to take morning of surgery ----- Norvasc Diabetic medication ----- n/a Patient instructed to bring photo id and insurance card day of surgery Patient aware to have Driver (ride ) / caregiver    for 24 hours after surgery -- wife, [place] Patient Special Instructions ----- n/a Pre-Op special Istructions ----- n/a Patient verbalized understanding of instructions that were given at this phone interview. Patient denies shortness of breath, chest pain, fever, cough at this phone interview.

## 2020-06-14 ENCOUNTER — Other Ambulatory Visit (HOSPITAL_COMMUNITY)
Admission: RE | Admit: 2020-06-14 | Discharge: 2020-06-14 | Disposition: A | Discharge status: Home / Self Care | Payer: BC Managed Care – PPO | Source: Ambulatory Visit | Attending: Urology | Admitting: Urology

## 2020-06-14 DIAGNOSIS — Z01812 Encounter for preprocedural laboratory examination: Secondary | ICD-10-CM | POA: Insufficient documentation

## 2020-06-14 DIAGNOSIS — Z20822 Contact with and (suspected) exposure to covid-19: Secondary | ICD-10-CM | POA: Diagnosis not present

## 2020-06-14 LAB — SARS CORONAVIRUS 2 (TAT 6-24 HRS): SARS Coronavirus 2: NEGATIVE

## 2020-06-18 ENCOUNTER — Ambulatory Visit (HOSPITAL_BASED_OUTPATIENT_CLINIC_OR_DEPARTMENT_OTHER): Payer: BC Managed Care – PPO | Admitting: Anesthesiology

## 2020-06-18 ENCOUNTER — Encounter (HOSPITAL_BASED_OUTPATIENT_CLINIC_OR_DEPARTMENT_OTHER): Payer: Self-pay | Admitting: Urology

## 2020-06-18 ENCOUNTER — Encounter (HOSPITAL_BASED_OUTPATIENT_CLINIC_OR_DEPARTMENT_OTHER)
Admission: RE | Disposition: A | Discharge status: Home / Self Care | Payer: Self-pay | Source: Home / Self Care | Attending: Urology

## 2020-06-18 ENCOUNTER — Other Ambulatory Visit: Payer: Self-pay

## 2020-06-18 ENCOUNTER — Ambulatory Visit (HOSPITAL_BASED_OUTPATIENT_CLINIC_OR_DEPARTMENT_OTHER)
Admission: RE | Admit: 2020-06-18 | Discharge: 2020-06-18 | Disposition: A | Discharge status: Home / Self Care | Payer: BC Managed Care – PPO | Attending: Urology | Admitting: Urology

## 2020-06-18 DIAGNOSIS — K219 Gastro-esophageal reflux disease without esophagitis: Secondary | ICD-10-CM | POA: Diagnosis not present

## 2020-06-18 DIAGNOSIS — Z302 Encounter for sterilization: Secondary | ICD-10-CM | POA: Diagnosis not present

## 2020-06-18 DIAGNOSIS — I10 Essential (primary) hypertension: Secondary | ICD-10-CM | POA: Diagnosis not present

## 2020-06-18 HISTORY — DX: Presence of spectacles and contact lenses: Z97.3

## 2020-06-18 HISTORY — DX: Gastro-esophageal reflux disease without esophagitis: K21.9

## 2020-06-18 HISTORY — DX: Calculus of kidney: N20.0

## 2020-06-18 HISTORY — PX: VASECTOMY: SHX75

## 2020-06-18 LAB — POCT I-STAT, CHEM 8
BUN: 15 mg/dL (ref 6–20)
Calcium, Ion: 1.18 mmol/L (ref 1.15–1.40)
Chloride: 105 mmol/L (ref 98–111)
Creatinine, Ser: 0.8 mg/dL (ref 0.61–1.24)
Glucose, Bld: 88 mg/dL (ref 70–99)
HCT: 42 % (ref 39.0–52.0)
Hemoglobin: 14.3 g/dL (ref 13.0–17.0)
Potassium: 3.6 mmol/L (ref 3.5–5.1)
Sodium: 142 mmol/L (ref 135–145)
TCO2: 25 mmol/L (ref 22–32)

## 2020-06-18 SURGERY — VASECTOMY
Anesthesia: General | Site: Scrotum | Laterality: Bilateral

## 2020-06-18 MED ORDER — LIDOCAINE HCL 1 % IJ SOLN
INTRAMUSCULAR | Status: DC | PRN
  Administered 2020-06-18: 5 mL

## 2020-06-18 MED ORDER — GLYCOPYRROLATE 0.2 MG/ML IJ SOLN
INTRAMUSCULAR | Status: DC | PRN
  Administered 2020-06-18: .1 mg via INTRAVENOUS

## 2020-06-18 MED ORDER — FENTANYL CITRATE (PF) 100 MCG/2ML IJ SOLN
INTRAMUSCULAR | Status: AC
  Filled 2020-06-18: qty 2

## 2020-06-18 MED ORDER — BUPIVACAINE HCL (PF) 0.25 % IJ SOLN
INTRAMUSCULAR | Status: DC | PRN
  Administered 2020-06-18: 5 mL

## 2020-06-18 MED ORDER — KETOROLAC TROMETHAMINE 30 MG/ML IJ SOLN
30.0000 mg | Freq: Once | INTRAMUSCULAR | Status: AC | PRN
  Administered 2020-06-18: 30 mg via INTRAVENOUS

## 2020-06-18 MED ORDER — LIDOCAINE HCL (CARDIAC) PF 100 MG/5ML IV SOSY
PREFILLED_SYRINGE | INTRAVENOUS | Status: DC | PRN
  Administered 2020-06-18: 60 mg via INTRAVENOUS

## 2020-06-18 MED ORDER — MIDAZOLAM HCL 2 MG/2ML IJ SOLN
INTRAMUSCULAR | Status: AC
  Filled 2020-06-18: qty 2

## 2020-06-18 MED ORDER — MEPERIDINE HCL 25 MG/ML IJ SOLN: 6.2500 mg | INTRAMUSCULAR | Status: DC | PRN

## 2020-06-18 MED ORDER — CEFAZOLIN SODIUM-DEXTROSE 2-4 GM/100ML-% IV SOLN
INTRAVENOUS | Status: AC
  Filled 2020-06-18: qty 100

## 2020-06-18 MED ORDER — PROPOFOL 10 MG/ML IV BOLUS
INTRAVENOUS | Status: DC | PRN
  Administered 2020-06-18: 200 mg via INTRAVENOUS

## 2020-06-18 MED ORDER — HYDROMORPHONE HCL 1 MG/ML IJ SOLN
INTRAMUSCULAR | Status: AC
  Filled 2020-06-18: qty 1

## 2020-06-18 MED ORDER — ACETAMINOPHEN 500 MG PO TABS
ORAL_TABLET | ORAL | Status: AC
  Filled 2020-06-18: qty 2

## 2020-06-18 MED ORDER — MIDAZOLAM HCL 2 MG/2ML IJ SOLN
INTRAMUSCULAR | Status: DC | PRN
  Administered 2020-06-18: 1 mg via INTRAVENOUS

## 2020-06-18 MED ORDER — PROMETHAZINE HCL 25 MG/ML IJ SOLN
INTRAMUSCULAR | Status: AC
  Filled 2020-06-18: qty 1

## 2020-06-18 MED ORDER — FENTANYL CITRATE (PF) 100 MCG/2ML IJ SOLN
INTRAMUSCULAR | Status: DC | PRN
  Administered 2020-06-18 (×2): 50 ug via INTRAVENOUS

## 2020-06-18 MED ORDER — 0.9 % SODIUM CHLORIDE (POUR BTL) OPTIME
TOPICAL | Status: DC | PRN
  Administered 2020-06-18: 500 mL

## 2020-06-18 MED ORDER — PROPOFOL 10 MG/ML IV BOLUS
INTRAVENOUS | Status: AC
  Filled 2020-06-18: qty 20

## 2020-06-18 MED ORDER — OXYCODONE HCL 5 MG/5ML PO SOLN
5.0000 mg | Freq: Once | ORAL | Status: DC | PRN
Start: 2020-06-18 — End: 2020-06-18

## 2020-06-18 MED ORDER — OXYCODONE HCL 5 MG PO TABS: 5.0000 mg | ORAL_TABLET | Freq: Once | ORAL | Status: DC | PRN

## 2020-06-18 MED ORDER — PROMETHAZINE HCL 25 MG/ML IJ SOLN
6.2500 mg | INTRAMUSCULAR | Status: DC | PRN
  Administered 2020-06-18: 6.25 mg via INTRAVENOUS

## 2020-06-18 MED ORDER — ONDANSETRON HCL 4 MG/2ML IJ SOLN
INTRAMUSCULAR | Status: DC | PRN
  Administered 2020-06-18: 4 mg via INTRAVENOUS

## 2020-06-18 MED ORDER — HYDROMORPHONE HCL 1 MG/ML IJ SOLN
0.2500 mg | INTRAMUSCULAR | Status: DC | PRN
  Administered 2020-06-18 (×3): 0.25 mg via INTRAVENOUS

## 2020-06-18 MED ORDER — KETOROLAC TROMETHAMINE 30 MG/ML IJ SOLN
INTRAMUSCULAR | Status: AC
  Filled 2020-06-18: qty 1

## 2020-06-18 MED ORDER — LACTATED RINGERS IV SOLN: INTRAVENOUS | Status: DC

## 2020-06-18 MED ORDER — DEXAMETHASONE SODIUM PHOSPHATE 4 MG/ML IJ SOLN
INTRAMUSCULAR | Status: DC | PRN
  Administered 2020-06-18: 8 mg via INTRAVENOUS

## 2020-06-18 MED ORDER — CEFAZOLIN SODIUM-DEXTROSE 2-4 GM/100ML-% IV SOLN
2.0000 g | Freq: Once | INTRAVENOUS | Status: AC
  Administered 2020-06-18: 2 g via INTRAVENOUS

## 2020-06-18 MED ORDER — ACETAMINOPHEN 500 MG PO TABS
1000.0000 mg | ORAL_TABLET | Freq: Once | ORAL | Status: AC
  Administered 2020-06-18: 1000 mg via ORAL

## 2020-06-18 SURGICAL SUPPLY — 32 items
ADH SKN CLS APL DERMABOND .7 (GAUZE/BANDAGES/DRESSINGS)
BLADE SURG 15 STRL LF DISP TIS (BLADE) ×1 IMPLANT
BLADE SURG 15 STRL SS (BLADE) ×2
BNDG GAUZE ELAST 4 BULKY (GAUZE/BANDAGES/DRESSINGS) ×2 IMPLANT
CLIP VESOCCLUDE MED 6/CT (CLIP) ×3 IMPLANT
CLOTH BEACON ORANGE TIMEOUT ST (SAFETY) ×2 IMPLANT
COVER BACK TABLE 60X90IN (DRAPES) ×2 IMPLANT
COVER MAYO STAND STRL (DRAPES) ×2 IMPLANT
COVER WAND RF STERILE (DRAPES) ×2 IMPLANT
DERMABOND ADVANCED (GAUZE/BANDAGES/DRESSINGS)
DERMABOND ADVANCED .7 DNX12 (GAUZE/BANDAGES/DRESSINGS) IMPLANT
DRAPE LAPAROTOMY 100X72 PEDS (DRAPES) ×2 IMPLANT
DRSG TELFA 3X8 NADH (GAUZE/BANDAGES/DRESSINGS) ×2 IMPLANT
ELECT REM PT RETURN 9FT ADLT (ELECTROSURGICAL) ×2
ELECTRODE REM PT RTRN 9FT ADLT (ELECTROSURGICAL) ×1 IMPLANT
GAUZE SPONGE 4X4 12PLY STRL (GAUZE/BANDAGES/DRESSINGS) ×1 IMPLANT
GLOVE SURG ENC MOIS LTX SZ7.5 (GLOVE) ×2 IMPLANT
GLOVE SURG ENC MOIS LTX SZ8 (GLOVE) IMPLANT
GOWN STRL REUS W/TWL LRG LVL3 (GOWN DISPOSABLE) ×4 IMPLANT
KIT TURNOVER CYSTO (KITS) ×2 IMPLANT
MANIFOLD NEPTUNE II (INSTRUMENTS) IMPLANT
NEEDLE HYPO 22GX1.5 SAFETY (NEEDLE) ×2 IMPLANT
PACK BASIN DAY SURGERY FS (CUSTOM PROCEDURE TRAY) ×2 IMPLANT
PAD DRESSING TELFA 3X8 NADH (GAUZE/BANDAGES/DRESSINGS) ×1 IMPLANT
PENCIL SMOKE EVACUATOR (MISCELLANEOUS) ×2 IMPLANT
SUPPORT SCROTAL LG STRP (MISCELLANEOUS) ×2 IMPLANT
SUT CHROMIC 3 0 PS 2 (SUTURE) ×3 IMPLANT
SUT VIC AB 2-0 PS2 27 (SUTURE) ×2 IMPLANT
SYR CONTROL 10ML LL (SYRINGE) ×2 IMPLANT
TOWEL OR 17X26 10 PK STRL BLUE (TOWEL DISPOSABLE) ×2 IMPLANT
TRAY DSU PREP LF (CUSTOM PROCEDURE TRAY) ×2 IMPLANT
TUBE CONNECTING 12X1/4 (SUCTIONS) IMPLANT

## 2020-06-18 NOTE — Op Note (Signed)
Preoperative diagnosis: Desires sterilization Postoperative diagnosis: Desires sterilization  Procedure: Bilateral vasectomy  Surgeon: Mena Goes  Anesthesia: General  Indication for procedure: Vonna Kotyk is a 40 year old male with a history of right undescended testicle and orchiopexy as a child.  He desired vasectomy and the right testicle was atrophic and high riding and difficult to find the right vas.  He was brought for vasectomy under anesthesia.  Findings: Both testicles were palpably normal without mass.  Bilateral vas deferens palpable and normal.  Description of procedure: After consent was obtained patient brought to the operating room.  After adequate anesthesia the external genitalia were prepped and draped in usual sterile fashion.  The right vas was grasped with the fingers and a puncture hole made in the scrotal wall local was injected around the vas (lidocaine and Marcaine 50-50 plain).  The ring clamp was placed on the right vas and it was delivered to the wound.  The sheath was incised with the 15 blade and the vas were grasped with pickups with teeth and the sheath cleared off the vas.  The vas was cut with the scalpel to confirm a lumen and the lumen was cauterized.  The sheath was closed with a small to medium clip.  Tension on the vas traced it to the right inguinal canal.  A small centimeter section of this was removed and then the abdominal then cauterized and buried under the sheath with a clip.  The left vas was delivered and treated in an identical fashion through the same scrotal opening.  The remainder of the local was instilled around both cords for a cord block total of 10 cc.  5 on each side.  The scrotal opening was closed with a single horizontal mattress 3-0 chromic suture.  Gauze and briefs were placed.  He was then awakened taken recovery room in stable condition.  Complications: None  Blood loss: Minimal  Specimens: None  Drains: None  Disposition: Patient stable  to PACU.  I spoke with Mrs. Thoman and went over the procedure, postop care and follow-up.  Also reminded her vasectomy does not produce immediate sterilization and continue other birth control until semen shows no sperm.  She was given the typical Kalisz bag with cups and instructions.

## 2020-06-18 NOTE — Discharge Instructions (Signed)
Post Anesthesia Home Care Instructions  Activity: Get plenty of rest for the remainder of the day. A responsible individual must stay with you for 24 hours following the procedure.  For the next 24 hours, DO NOT: -Drive a car -Advertising copywriter -Drink alcoholic beverages -Take any medication unless instructed by your physician -Make any legal decisions or sign important papers.  Meals: Start with liquid foods such as gelatin or soup. Progress to regular foods as tolerated. Avoid greasy, spicy, heavy foods. If nausea and/or vomiting occur, drink only clear liquids until the nausea and/or vomiting subsides. Call your physician if vomiting continues.  Special Instructions/Symptoms: Your throat may feel dry or sore from the anesthesia or the breathing tube placed in your throat during surgery. If this causes discomfort, gargle with warm salt water. The discomfort should disappear within 24 hours.  If you had a scopolamine patch placed behind your ear for the management of post- operative nausea and/or vomiting:  1. The medication in the patch is effective for 72 hours, after which it should be removed.  Wrap patch in a tissue and discard in the trash. Wash hands thoroughly with soap and water. 2. You may remove the patch earlier than 72 hours if you experience unpleasant side effects which may include dry mouth, dizziness or visual disturbances. 3. Avoid touching the patch. Wash your hands with soap and water after contact with the patch.     Vasectomy, Care After This sheet gives you information about how to care for yourself after your procedure. Your health care provider may also give you more specific instructions. If you have problems or questions, contact your health care provider. What can I expect after the procedure? After the procedure, it is common to have:  Mild pain, swelling, or discomfort in your scrotum or redness on your scrotum.  Some blood coming from your incisions or  puncture sites for 1 or 2 days.  Blood in your semen. Follow these instructions at home: Medicines  Take over-the-counter and prescription medicines only as told by your health care provider.  Avoid taking any medicines that contain aspirin or NSAIDs, such as ibuprofen. These medicines can make bleeding worse. Activity  For the first 2 days after surgery, avoid physical activity and exercise that requires a lot of energy. Ask your health care provider what activities are safe for you.  Do not take part in sports or perform heavy physical labor until your pain has improved, or until your health care provider says it is okay.  You may have limits on the amount of weight you can lift as told by your health care provider.  Do not ejaculate for at least 1 week after the procedure, or for as long as you are told.  You may resume sexual activity 7-10 days after your procedure, or when your health care provider approves. Use a different method of birth control (contraception) until you have had test results that confirm that there is no sperm in your semen. Scrotal support  Use scrotal support, such as a jockstrap or underwear with a supportive pouch, as needed for 1 week after your procedure.  If you feel discomfort in your scrotum, you may remove the scrotal support to see if the discomfort is relieved. Sometimes scrotal support can press on the scrotum and cause or worsen discomfort.  If your skin gets irritated, you may add some germ-free (sterile), fluffed bandages or a clean washcloth to the scrotal support. Managing pain and swelling If directed,  put ice on the affected area. To do this:  Put ice in a plastic bag.  Place a towel between your skin and the bag.  Leave the ice on for 20 minutes, 2-3 times a day.  Remove the ice if your skin turns bright red. This is very important. If you cannot feel pain, heat, or cold, you have a greater risk of damage to the area.   General  instructions  Check your incisions or puncture sites every day for signs of infection. Check for: ? Redness, swelling, or more pain. ? Fluid or blood. ? Warmth. ? Pus or a bad smell.  Leave stitches (sutures) in place. The sutures will dissolve on their own and do not need to be removed.  Keep all follow-up visits. This is important because you will need a test to confirm that there is no sperm in your semen. Multiple ejaculations are needed to clear out sperm that were beyond the vasectomy site. You will need one test result showing that there is no sperm in your semen before you can resume unprotected sex. This may take 2-4 months after your procedure.  If you were given a sedative during the procedure, it can affect you for several hours. Do not drive or operate machinery until your health care provider says that it is safe.   Contact a health care provider if:  You have redness, swelling, or more pain around an incision or puncture site, or in your scrotum area.  You have bleeding from an incision or puncture site.  You have pus or a bad smell coming from an incision or puncture site.  You have a fever.  An incision or puncture site opens up. Get help right away if:  You develop a rash.  You have trouble breathing. Summary  After your procedure, it is common to have mild pain, swelling, redness, or discomfort in your scrotum.  For the first 2 days after surgery, avoid physical activity and exercise that requires a lot of energy.  Put ice on the affected area. Leave the ice on for 20 minutes, 2-3 times a day.  If you were given a sedative during the procedure, it can affect you for several hours. Do not drive or operate machinery until your health care provider says that it is safe. This information is not intended to replace advice given to you by your health care provider. Make sure you discuss any questions you have with your health care provider. Document Revised:  06/29/2019 Document Reviewed: 06/29/2019 Elsevier Patient Education  2021 [employer].

## 2020-06-18 NOTE — Anesthesia Postprocedure Evaluation (Signed)
Anesthesia Post Note  Patient: Willie Simpson  Procedure(s) Performed: VASECTOMY (Bilateral Scrotum)     Patient location during evaluation: PACU Anesthesia Type: General Level of consciousness: awake and alert, oriented and patient cooperative Pain management: pain level controlled Vital Signs Assessment: post-procedure vital signs reviewed and stable Respiratory status: spontaneous breathing, nonlabored ventilation and respiratory function stable Cardiovascular status: blood pressure returned to baseline and stable Postop Assessment: no apparent nausea or vomiting Anesthetic complications: no   No complications documented.  Last Vitals:  Vitals:   06/18/20 1645 06/18/20 1649  BP:  (!) 137/91  Pulse: 83 66  Resp: 13 17  Temp: 36.6 C   SpO2: 98% 92%    Last Pain:  Vitals:   06/18/20 1649  TempSrc:   PainSc: 2                  Lannie Fields

## 2020-06-18 NOTE — Anesthesia Procedure Notes (Signed)
Procedure Name: LMA Insertion Date/Time: 06/18/2020 3:03 PM Performed by: Earmon Phoenix, CRNA Pre-anesthesia Checklist: Patient identified, Emergency Drugs available, Suction available, Patient being monitored and Timeout performed Patient Re-evaluated:Patient Re-evaluated prior to induction Oxygen Delivery Method: Circle system utilized Preoxygenation: Pre-oxygenation with 100% oxygen Induction Type: IV induction Ventilation: Mask ventilation without difficulty LMA Size: 4.0 Number of attempts: 1 Placement Confirmation: positive ETCO2,  CO2 detector and breath sounds checked- equal and bilateral Tube secured with: Tape Dental Injury: Teeth and Oropharynx as per pre-operative assessment

## 2020-06-18 NOTE — H&P (Signed)
H&P  Chief Complaint: Desires sterilization  History of Present Illness: Willie Simpson is a 40 year old male who has considered vasectomy for several years.  He is married and has 3 kids.  They have a "COVID baby" as he describes them.  He has a history of right undescended testicle which was repaired as a child.  On exam the right testicle was atrophic and higher riding and the vas is difficult to palpate.  Therefore he was brought for general anesthesia for his vasectomy in case the testicle needs to be delivered.  He is doing well without fever, cough or congestion.  No dysuria or gross hematuria.   Past Medical History:  Diagnosis Date  . GERD (gastroesophageal reflux disease)   . History of kidney stones   . Hypertension    followed by pcp  . Nephrolithiasis    06-12-2020  per pt told he does have renal stones that are nonosbstructive  . Wears glasses    Past Surgical History:  Procedure Laterality Date  . COLONOSCOPY  08-22-2007  . CYSTOSCOPY W/ RETROGRADES Right 09/03/2015   Procedure: CYSTOSCOPY WITH RETROGRADE PYELOGRAM;  Surgeon: Jerilee Field, MD;  Location: [facility];  Service: Urology;  Laterality: Right;  . CYSTOSCOPY/URETEROSCOPY/HOLMIUM LASER/STENT PLACEMENT Right 09/03/2015   Procedure: RIGHT URETEROSCOPY/HOLMIUM LASER LITHOTRIPSY/STENT PLACEMENT;  Surgeon: Jerilee Field, MD;  Location: [facility];  Service: Urology;  Laterality: Right;  . EXTRACORPOREAL SHOCK WAVE LITHOTRIPSY Right 06-27-2015  . HOLMIUM LASER APPLICATION Right 09/03/2015   Procedure: HOLMIUM LASER APPLICATION;  Surgeon: Jerilee Field, MD;  Location: [facility];  Service: Urology;  Laterality: Right;  . ORCHIOPEXY Right age 73   undescended testis  . STONE EXTRACTION WITH BASKET Right 09/03/2015   Procedure: STONE EXTRACTION WITH BASKET;  Surgeon: Jerilee Field, MD;  Location: [facility];  Service: Urology;  Laterality: Right;    Home  Medications:  No medications prior to admission.   Allergies: No Known Allergies  History reviewed. No pertinent family history. Social History:  reports that he has never smoked. He has never used smokeless tobacco. He reports current alcohol use of about 1.0 - 2.0 standard drink of alcohol per week. He reports that he does not use drugs.  ROS: A complete review of systems was performed.  All systems are negative except for pertinent findings as noted. Review of Systems  All other systems reviewed and are negative.    Physical Exam:  Vital signs in last 24 hours:   General:  Alert and oriented, No acute distress HEENT: Normocephalic, atraumatic Cardiovascular: Regular rate and rhythm Lungs: Regular rate and effort Abdomen: Soft, nontender, nondistended, no abdominal masses Back: No CVA tenderness Extremities: No edema Neurologic: Grossly intact  Laboratory Data:  No results found for this or any previous visit (from the past 24 hour(s)). Recent Results (from the past 240 hour(s))  SARS CORONAVIRUS 2 (TAT 6-24 HRS) Nasopharyngeal Nasopharyngeal Swab     Status: None   Collection Time: 06/14/20  2:53 PM   Specimen: Nasopharyngeal Swab  Result Value Ref Range Status   SARS Coronavirus 2 NEGATIVE NEGATIVE Final    Comment: (NOTE) SARS-CoV-2 target nucleic acids are NOT DETECTED.  The SARS-CoV-2 RNA is generally detectable in upper and lower respiratory specimens during the acute phase of infection. Negative results do not preclude SARS-CoV-2 infection, do not rule out co-infections with other pathogens, and should not be used as the sole basis for treatment or other patient management decisions. Negative results  must be combined with clinical observations, patient history, and epidemiological information. The expected result is Negative.  Fact Sheet for Patients: HairSlick.no  Fact Sheet for Healthcare  Providers: quierodirigir.com  This test is not yet approved or cleared by the [place] FDA and  has been authorized for detection and/or diagnosis of SARS-CoV-2 by FDA under an Emergency Use Authorization (EUA). This EUA will remain  in effect (meaning this test can be used) for the duration of the COVID-19 declaration under Se ction 564(b)(1) of the Act, 21 U.S.C. section 360bbb-3(b)(1), unless the authorization is terminated or revoked sooner.  Performed at [facility], [address] [place], [place] [address]    Creatinine: No results for input(s): CREATININE in the last 168 hours.  Impression/Assessment/plan:  We discussed this a couple of times but I went over again vasectomy is intended to be a permanent form of contraception. Although options do exist for fertility after vasectomy they are not always successful and can be quite expensive. We discussed vasectomy does not produce immediate sterility and another  form of contraception is required until a postvasectomy semen analysis confirms no sperm. We discussed this can take several weeks to months. We discussed that vasectomy is not 100% successful with the risk of pregnancy approximately 1 in 2000 following the procedure which may be due to late failure from rejoining of the vas ends.  Rarely repeat vasectomy is required. We discussed risks such as bleeding, infection, testicular atrophy, sperm granuloma, acute pain and permanent chronic scrotal pain among others and he is higher risk due to prior testicle surgery. We discussed there are other permanent and nonpermanent alternatives to vasectomy including male sterilization. We discussed postop care and that no heavy lifting, ejaculation or strenuous exercise is recommended for one week. All questions answered. After careful consideration of the nature, potential benefits, risks and alternatives to vasectomy, including side effects of the proposed  treatment, the likelihood of the patient achieving the goals of the procedure, and any potential problems that might occur during the procedure or recuperation, answering of all questions, the patient elects to proceed.     Jerilee Field 06/18/2020, 10:57 AM

## 2020-06-18 NOTE — Anesthesia Preprocedure Evaluation (Addendum)
Anesthesia Evaluation  Patient identified by MRN, date of birth, ID band Patient awake    Reviewed: Allergy & Precautions, NPO status , Patient's Chart, lab work & pertinent test results  Airway Mallampati: I  TM Distance: >3 FB Neck ROM: Full    Dental no notable dental hx. (+) Teeth Intact, Dental Advisory Given   Pulmonary neg pulmonary ROS,    Pulmonary exam normal breath sounds clear to auscultation       Cardiovascular hypertension, Pt. on medications Normal cardiovascular exam Rhythm:Regular Rate:Normal     Neuro/Psych negative neurological ROS  negative psych ROS   GI/Hepatic Neg liver ROS, GERD  Controlled,  Endo/Other  negative endocrine ROS  Renal/GU negative Renal ROS  negative genitourinary   Musculoskeletal negative musculoskeletal ROS (+)   Abdominal   Peds  Hematology negative hematology ROS (+)   Anesthesia Other Findings   Reproductive/Obstetrics Desires sterility                           Anesthesia Physical Anesthesia Plan  ASA: II  Anesthesia Plan: General   Post-op Pain Management:    Induction: Intravenous  PONV Risk Score and Plan: 3 and Ondansetron, Dexamethasone, Midazolam and Treatment may vary due to age or medical condition  Airway Management Planned: LMA  Additional Equipment: None  Intra-op Plan:   Post-operative Plan: Extubation in OR  Informed Consent: I have reviewed the patients History and Physical, chart, labs and discussed the procedure including the risks, benefits and alternatives for the proposed anesthesia with the patient or authorized representative who has indicated his/her understanding and acceptance.     Dental advisory given  Plan Discussed with: CRNA  Anesthesia Plan Comments:        Anesthesia Quick Evaluation

## 2020-06-18 NOTE — Transfer of Care (Signed)
Immediate Anesthesia Transfer of Care Note  Patient: FRANCO DULEY  Procedure(s) Performed: VASECTOMY (Bilateral Scrotum)  Patient Location: PACU  Anesthesia Type:General  Level of Consciousness: awake and patient cooperative  Airway & Oxygen Therapy: Patient Spontanous Breathing and Patient connected to nasal cannula oxygen  Post-op Assessment: Report given to RN and Post -op Vital signs reviewed and stable  Post vital signs: Reviewed and stable  Last Vitals:  Vitals Value Taken Time  BP    Temp    Pulse    Resp    SpO2      Last Pain:  Vitals:   06/18/20 1246  TempSrc: Oral  PainSc: 0-No pain      Patients Stated Pain Goal: 5 (06/18/20 1246)  Complications: No complications documented.

## 2020-06-20 ENCOUNTER — Encounter (HOSPITAL_BASED_OUTPATIENT_CLINIC_OR_DEPARTMENT_OTHER): Payer: Self-pay | Admitting: Urology

## 2020-07-02 DIAGNOSIS — N202 Calculus of kidney with calculus of ureter: Secondary | ICD-10-CM | POA: Diagnosis not present

## 2020-10-01 DIAGNOSIS — N200 Calculus of kidney: Secondary | ICD-10-CM | POA: Diagnosis not present

## 2020-10-31 DIAGNOSIS — H40013 Open angle with borderline findings, low risk, bilateral: Secondary | ICD-10-CM | POA: Diagnosis not present

## 2020-11-27 DIAGNOSIS — J209 Acute bronchitis, unspecified: Secondary | ICD-10-CM | POA: Diagnosis not present

## 2020-11-27 DIAGNOSIS — J040 Acute laryngitis: Secondary | ICD-10-CM | POA: Diagnosis not present

## 2021-04-17 DIAGNOSIS — M722 Plantar fascial fibromatosis: Secondary | ICD-10-CM | POA: Diagnosis not present

## 2021-04-27 DIAGNOSIS — J0300 Acute streptococcal tonsillitis, unspecified: Secondary | ICD-10-CM | POA: Diagnosis not present

## 2021-05-01 DIAGNOSIS — H40013 Open angle with borderline findings, low risk, bilateral: Secondary | ICD-10-CM | POA: Diagnosis not present

## 2021-05-06 DIAGNOSIS — Z0000 Encounter for general adult medical examination without abnormal findings: Secondary | ICD-10-CM | POA: Diagnosis not present

## 2021-05-06 DIAGNOSIS — Z131 Encounter for screening for diabetes mellitus: Secondary | ICD-10-CM | POA: Diagnosis not present

## 2021-05-06 DIAGNOSIS — I10 Essential (primary) hypertension: Secondary | ICD-10-CM | POA: Diagnosis not present

## 2021-05-06 DIAGNOSIS — Z13220 Encounter for screening for lipoid disorders: Secondary | ICD-10-CM | POA: Diagnosis not present

## 2021-11-07 DIAGNOSIS — N200 Calculus of kidney: Secondary | ICD-10-CM | POA: Diagnosis not present

## 2021-11-27 DIAGNOSIS — H40013 Open angle with borderline findings, low risk, bilateral: Secondary | ICD-10-CM | POA: Diagnosis not present

## 2021-12-18 DIAGNOSIS — R051 Acute cough: Secondary | ICD-10-CM | POA: Diagnosis not present

## 2021-12-18 DIAGNOSIS — B349 Viral infection, unspecified: Secondary | ICD-10-CM | POA: Diagnosis not present

## 2022-01-22 DIAGNOSIS — R5383 Other fatigue: Secondary | ICD-10-CM | POA: Diagnosis not present

## 2022-01-22 DIAGNOSIS — R002 Palpitations: Secondary | ICD-10-CM | POA: Diagnosis not present

## 2022-03-28 DIAGNOSIS — J00 Acute nasopharyngitis [common cold]: Secondary | ICD-10-CM | POA: Diagnosis not present

## 2022-05-13 DIAGNOSIS — Z125 Encounter for screening for malignant neoplasm of prostate: Secondary | ICD-10-CM | POA: Diagnosis not present

## 2022-05-13 DIAGNOSIS — Z131 Encounter for screening for diabetes mellitus: Secondary | ICD-10-CM | POA: Diagnosis not present

## 2022-05-13 DIAGNOSIS — I10 Essential (primary) hypertension: Secondary | ICD-10-CM | POA: Diagnosis not present

## 2022-05-13 DIAGNOSIS — Z13220 Encounter for screening for lipoid disorders: Secondary | ICD-10-CM | POA: Diagnosis not present

## 2022-05-13 DIAGNOSIS — Z0000 Encounter for general adult medical examination without abnormal findings: Secondary | ICD-10-CM | POA: Diagnosis not present

## 2022-06-04 DIAGNOSIS — H40013 Open angle with borderline findings, low risk, bilateral: Secondary | ICD-10-CM | POA: Diagnosis not present

## 2022-06-23 DIAGNOSIS — R7303 Prediabetes: Secondary | ICD-10-CM | POA: Diagnosis not present

## 2022-06-23 DIAGNOSIS — Z713 Dietary counseling and surveillance: Secondary | ICD-10-CM | POA: Diagnosis not present

## 2022-06-23 DIAGNOSIS — I10 Essential (primary) hypertension: Secondary | ICD-10-CM | POA: Diagnosis not present

## 2022-11-20 DIAGNOSIS — R7303 Prediabetes: Secondary | ICD-10-CM | POA: Diagnosis not present

## 2022-12-03 DIAGNOSIS — H40013 Open angle with borderline findings, low risk, bilateral: Secondary | ICD-10-CM | POA: Diagnosis not present

## 2023-05-24 DIAGNOSIS — J069 Acute upper respiratory infection, unspecified: Secondary | ICD-10-CM | POA: Diagnosis not present

## 2023-05-28 DIAGNOSIS — Z0000 Encounter for general adult medical examination without abnormal findings: Secondary | ICD-10-CM | POA: Diagnosis not present

## 2023-05-28 DIAGNOSIS — K219 Gastro-esophageal reflux disease without esophagitis: Secondary | ICD-10-CM | POA: Diagnosis not present

## 2023-05-28 DIAGNOSIS — I10 Essential (primary) hypertension: Secondary | ICD-10-CM | POA: Diagnosis not present

## 2023-05-28 DIAGNOSIS — R7303 Prediabetes: Secondary | ICD-10-CM | POA: Diagnosis not present

## 2023-06-07 DIAGNOSIS — H40013 Open angle with borderline findings, low risk, bilateral: Secondary | ICD-10-CM | POA: Diagnosis not present

## 2023-09-03 DIAGNOSIS — M722 Plantar fascial fibromatosis: Secondary | ICD-10-CM | POA: Diagnosis not present

## 2023-11-14 DIAGNOSIS — Z23 Encounter for immunization: Secondary | ICD-10-CM | POA: Diagnosis not present

## 2023-12-08 DIAGNOSIS — R7303 Prediabetes: Secondary | ICD-10-CM | POA: Diagnosis not present

## 2023-12-09 DIAGNOSIS — H40013 Open angle with borderline findings, low risk, bilateral: Secondary | ICD-10-CM | POA: Diagnosis not present

## 2023-12-14 DIAGNOSIS — K219 Gastro-esophageal reflux disease without esophagitis: Secondary | ICD-10-CM | POA: Diagnosis not present

## 2023-12-14 DIAGNOSIS — R0683 Snoring: Secondary | ICD-10-CM | POA: Diagnosis not present

## 2023-12-14 DIAGNOSIS — I10 Essential (primary) hypertension: Secondary | ICD-10-CM | POA: Diagnosis not present

## 2023-12-30 DIAGNOSIS — R0681 Apnea, not elsewhere classified: Secondary | ICD-10-CM | POA: Diagnosis not present

## 2023-12-30 DIAGNOSIS — R0683 Snoring: Secondary | ICD-10-CM | POA: Diagnosis not present

## 2023-12-30 DIAGNOSIS — G4719 Other hypersomnia: Secondary | ICD-10-CM | POA: Diagnosis not present

## 2023-12-30 DIAGNOSIS — K219 Gastro-esophageal reflux disease without esophagitis: Secondary | ICD-10-CM | POA: Diagnosis not present

## 2024-01-27 DIAGNOSIS — R0683 Snoring: Secondary | ICD-10-CM | POA: Diagnosis not present

## 2024-02-09 DIAGNOSIS — R0681 Apnea, not elsewhere classified: Secondary | ICD-10-CM | POA: Diagnosis not present

## 2024-02-09 DIAGNOSIS — I10 Essential (primary) hypertension: Secondary | ICD-10-CM | POA: Diagnosis not present

## 2024-02-09 DIAGNOSIS — R0683 Snoring: Secondary | ICD-10-CM | POA: Diagnosis not present
